# Patient Record
Sex: Male | Born: 2000 | Race: Black or African American | Hispanic: No | Marital: Single | Smoking: Current every day smoker
Health system: Southern US, Community
[De-identification: ages and names within clinical notes are randomized; demographics above are authoritative.]

## PROBLEM LIST (undated history)

## (undated) DIAGNOSIS — M1990 Unspecified osteoarthritis, unspecified site: Secondary | ICD-10-CM

## (undated) DIAGNOSIS — Z9109 Other allergy status, other than to drugs and biological substances: Secondary | ICD-10-CM

---
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
## 2020-09-16 ENCOUNTER — Other Ambulatory Visit: Payer: Self-pay

## 2020-09-16 ENCOUNTER — Encounter (HOSPITAL_COMMUNITY): Payer: Self-pay | Admitting: Emergency Medicine

## 2020-09-16 ENCOUNTER — Emergency Department (HOSPITAL_COMMUNITY)
Admission: EM | Admit: 2020-09-16 | Discharge: 2020-09-17 | Disposition: A | Discharge status: Home / Self Care | Payer: Medicaid Other | Attending: Emergency Medicine | Admitting: Emergency Medicine

## 2020-09-16 DIAGNOSIS — Z23 Encounter for immunization: Secondary | ICD-10-CM | POA: Insufficient documentation

## 2020-09-16 DIAGNOSIS — W268XXA Contact with other sharp object(s), not elsewhere classified, initial encounter: Secondary | ICD-10-CM | POA: Insufficient documentation

## 2020-09-16 DIAGNOSIS — S6991XA Unspecified injury of right wrist, hand and finger(s), initial encounter: Secondary | ICD-10-CM | POA: Diagnosis present

## 2020-09-16 DIAGNOSIS — S61411A Laceration without foreign body of right hand, initial encounter: Secondary | ICD-10-CM | POA: Diagnosis not present

## 2020-09-16 NOTE — ED Triage Notes (Signed)
Per EMS, pt was opening a can of greenbeans and cut his right palm.  Bleeding controlled, full movement of hand.  "Its been a minute" since last tetanus.

## 2020-09-17 MED ORDER — TETANUS-DIPHTH-ACELL PERTUSSIS 5-2.5-18.5 LF-MCG/0.5 IM SUSY
0.5000 mL | PREFILLED_SYRINGE | Freq: Once | INTRAMUSCULAR | Status: AC
  Administered 2020-09-17: 0.5 mL via INTRAMUSCULAR
  Filled 2020-09-17: qty 0.5

## 2020-09-17 NOTE — ED Provider Notes (Signed)
[facility] EMERGENCY DEPARTMENT Provider Note   CSN: [id number] Arrival date & time: 09/16/20  2339     History Chief Complaint  Patient presents with   Laceration    Justin Anderson is a 20 y.o. male.  The history is provided by the patient. No language interpreter was used.  Laceration Location:  Hand Hand laceration location:  R hand Depth:  Cutaneous Bleeding: uncontrolled   Pain details:    Quality:  Aching   Severity:  Moderate   Timing:  Constant   Progression:  Worsening Relieved by:  Nothing     Past Medical History:  Diagnosis Date   Arthritis     There are no problems to display for this patient.   History reviewed. No pertinent surgical history.     No family history on file.  Social History   Tobacco Use   Smoking status: Never   Smokeless tobacco: Never  Vaping Use   Vaping Use: Never used  Substance Use Topics   Alcohol use: No   Drug use: No    Home Medications Prior to Admission medications   Medication Sig Start Date End Date Taking? Authorizing Provider  amoxicillin (AMOXIL) 250 MG/5ML suspension Take 20 mLs (1,000 mg total) by mouth 2 (two) times daily. 04/09/12   Hess, Nada Boozer, PA-C    Allergies    Patient has no known allergies.  Review of Systems   Review of Systems  Skin:  Positive for wound.  All other systems reviewed and are negative.  Physical Exam Updated Vital Signs BP (!) 159/66 (BP Location: Right Arm)   Pulse 70   Temp 98.3 F (36.8 C) (Oral)   Resp 20   Ht 5\' 4"  (1.626 m)   Wt (!) 144.2 kg   SpO2 98%   BMI 54.58 kg/m   Physical Exam Cardiovascular:     Rate and Rhythm: Normal rate.  Pulmonary:     Effort: Pulmonary effort is normal.  Skin:    Comments: 2cm superficial laceration,  no gapping from  nv and ns intact  Neurological:     General: No focal deficit present.     Mental Status: He is alert.  Psychiatric:        Mood and Affect: Mood normal.    ED Results /  Procedures / Treatments   Labs (all labs ordered are listed, but only abnormal results are displayed) Labs Reviewed - No data to display  EKG None  Radiology No results found.  Procedures . Laceration Repair  Date/Time: 09/19/2020 1:32 PM Performed by: 09/21/2020, PA-C Authorized by: Elson Areas, PA-C   Consent:    Consent obtained:  Verbal   Consent given by:  Patient   Risks, benefits, and alternatives were discussed: yes     Alternatives discussed:  No treatment Universal protocol:    Procedure explained and questions answered to patient or proxy's satisfaction: yes     Patient identity confirmed:  Verbally with patient Anesthesia:    Anesthesia method:  None Laceration details:    Location: hand.   Length (cm):  2 Pre-procedure details:    Preparation:  Patient was prepped and draped in usual sterile fashion Exploration:    Wound exploration: wound explored through full range of motion     Contaminated: no   Treatment:    Area cleansed with:  Shur-Clens   Irrigation solution:  Sterile saline   Debridement:  None   Undermining:  None  Scar revision: no   Skin repair:    Repair method:  Tissue adhesive Approximation:    Approximation:  Loose Post-procedure details:    Procedure completion:  Tolerated   Medications Ordered in ED Medications  Tdap (BOOSTRIX) injection 0.5 mL (0.5 mLs Intramuscular Given 09/17/20 0706)    ED Course  I have reviewed the triage vital signs and the nursing notes.  Pertinent labs & imaging results that were available during my care of the patient were reviewed by me and considered in my medical decision making (see chart for details).    MDM Rules/Calculators/A&P                           MDM:  Pt counseled on dermabond Final Clinical Impression(s) / ED Diagnoses Final diagnoses:  Laceration of right hand, foreign body presence unspecified, initial encounter    Rx / DC Orders ED Discharge Orders     None      An After Visit Summary was printed and given to the patient.    Elson Areas, PA-C 09/19/20 1334    Franne Forts, DO 09/21/20 1911

## 2020-12-18 ENCOUNTER — Other Ambulatory Visit: Payer: Self-pay

## 2020-12-18 ENCOUNTER — Emergency Department (HOSPITAL_COMMUNITY)
Admission: EM | Admit: 2020-12-18 | Discharge: 2020-12-19 | Disposition: A | Discharge status: Home / Self Care | Payer: Medicaid Other | Attending: Emergency Medicine | Admitting: Emergency Medicine

## 2020-12-18 ENCOUNTER — Encounter (HOSPITAL_COMMUNITY): Payer: Self-pay

## 2020-12-18 DIAGNOSIS — Z20822 Contact with and (suspected) exposure to covid-19: Secondary | ICD-10-CM | POA: Diagnosis not present

## 2020-12-18 DIAGNOSIS — J111 Influenza due to unidentified influenza virus with other respiratory manifestations: Secondary | ICD-10-CM

## 2020-12-18 DIAGNOSIS — R799 Abnormal finding of blood chemistry, unspecified: Secondary | ICD-10-CM | POA: Insufficient documentation

## 2020-12-18 DIAGNOSIS — R509 Fever, unspecified: Secondary | ICD-10-CM | POA: Diagnosis present

## 2020-12-18 DIAGNOSIS — J101 Influenza due to other identified influenza virus with other respiratory manifestations: Secondary | ICD-10-CM | POA: Insufficient documentation

## 2020-12-18 LAB — CBG MONITORING, ED: Glucose-Capillary: 97 mg/dL (ref 70–99)

## 2020-12-18 MED ORDER — IBUPROFEN 200 MG PO TABS
600.0000 mg | ORAL_TABLET | Freq: Once | ORAL | Status: AC
  Administered 2020-12-18: 22:00:00 600 mg via ORAL
  Filled 2020-12-18: qty 3

## 2020-12-18 MED ORDER — ACETAMINOPHEN 325 MG PO TABS
650.0000 mg | ORAL_TABLET | Freq: Once | ORAL | Status: AC
  Administered 2020-12-18: 21:00:00 650 mg via ORAL
  Filled 2020-12-18: qty 2

## 2020-12-18 NOTE — ED Notes (Signed)
CBG of 29 inaccurate. Rechecked with a different machine.

## 2020-12-18 NOTE — ED Triage Notes (Signed)
Pt complains of fever and congestion x 1 day.

## 2020-12-18 NOTE — ED Provider Notes (Signed)
[facility]-EMERGENCY DEPT Provider Note   CSN: [id number] Arrival date & time: 12/18/20  2030     History Chief Complaint  Patient presents with   Fever   Nasal Congestion    Justin Anderson is a 20 y.o. male.  HPI Patient is a 20 year old male with past medical history significant for arthritis  Patient presented to the ER today with complaints of fever congestion sore throat fatigue ongoing for approximately 48 hours.  Denies any chest pain shortness breath lightheadedness or dizziness.  No nausea vomiting or diarrhea  No abdominal pain states he does have a mild cough however.  No other associate symptoms.  No aggravating mitigating factors has taken no medications prior to arrival in ER      Past Medical History:  Diagnosis Date   Arthritis     There are no problems to display for this patient.   History reviewed. No pertinent surgical history.     History reviewed. No pertinent family history.  Social History   Tobacco Use   Smoking status: Never   Smokeless tobacco: Never  Vaping Use   Vaping Use: Never used  Substance Use Topics   Alcohol use: No   Drug use: No    Home Medications Prior to Admission medications   Medication Sig Start Date End Date Taking? Authorizing Provider  ibuprofen (ADVIL) 600 MG tablet Take 1 tablet (600 mg total) by mouth every 6 (six) hours as needed for moderate pain, mild pain or fever. 12/19/20  Yes Derak Schurman S, PA  ondansetron (ZOFRAN-ODT) 4 MG disintegrating tablet Take 1 tablet (4 mg total) by mouth every 8 (eight) hours as needed for nausea or vomiting. 12/19/20  Yes Carlyle Achenbach S, PA  amoxicillin (AMOXIL) 250 MG/5ML suspension Take 20 mLs (1,000 mg total) by mouth 2 (two) times daily. 04/09/12   Hess, Nada Boozer, PA-C    Allergies    Patient has no known allergies.  Review of Systems   Review of Systems  Constitutional:  Positive for chills, fatigue and fever.  HENT:  Positive for  postnasal drip, rhinorrhea and sore throat. Negative for congestion.   Eyes:  Negative for redness.  Respiratory:  Positive for cough. Negative for shortness of breath.   Cardiovascular:  Negative for chest pain and leg swelling.  Gastrointestinal:  Negative for abdominal distention, abdominal pain, diarrhea, nausea and vomiting.  Endocrine: Negative for polyphagia.  Genitourinary:  Negative for dysuria.  Musculoskeletal:  Negative for myalgias.  Skin:  Negative for rash.  Neurological:  Negative for dizziness, syncope and headaches.  Psychiatric/Behavioral:  Negative for confusion.    Physical Exam Updated Vital Signs BP (!) 143/73 (BP Location: Left Arm)   Pulse 90   Temp 98.6 F (37 C) (Oral)   Resp 16   Ht 5\' 4"  (1.626 m)   Wt (!) 145.2 kg   SpO2 98%   BMI 54.93 kg/m   Physical Exam Vitals and nursing note reviewed.  Constitutional:      General: He is not in acute distress.    Comments: 20 year old male morbidly obese.  Warm to touch appears uncomfortable but in no acute distress does not appear toxic.  HENT:     Head: Normocephalic and atraumatic.     Nose: Nose normal.     Mouth/Throat:     Comments: Symmetric 2+ tonsillar hypertrophy some posterior pharyngeal erythema.  Uvula midline.  Normal phonation. Eyes:     General: No scleral icterus. Cardiovascular:  Rate and Rhythm: Regular rhythm. Tachycardia present.     Pulses: Normal pulses.     Heart sounds: Normal heart sounds.  Pulmonary:     Effort: Pulmonary effort is normal. No respiratory distress.     Breath sounds: Normal breath sounds. No wheezing.  Abdominal:     Palpations: Abdomen is soft.     Tenderness: There is no abdominal tenderness. There is no guarding or rebound.     Comments: Abdomen soft nontender  Musculoskeletal:     Cervical back: Normal range of motion.     Right lower leg: No edema.     Left lower leg: No edema.  Skin:    General: Skin is warm and dry.     Capillary Refill:  Capillary refill takes less than 2 seconds.  Neurological:     Mental Status: He is alert. Mental status is at baseline.  Psychiatric:        Mood and Affect: Mood normal.        Behavior: Behavior normal.    ED Results / Procedures / Treatments   Labs (all labs ordered are listed, but only abnormal results are displayed) Labs Reviewed  RESP PANEL BY RT-PCR (FLU A&B, COVID) ARPGX2 - Abnormal; Notable for the following components:      Result Value   Influenza A by PCR POSITIVE (*)    All other components within normal limits  CBG MONITORING, ED - Abnormal; Notable for the following components:   Glucose-Capillary 29 (*)    All other components within normal limits  GROUP A STREP BY PCR  CBG MONITORING, ED    EKG None  Radiology No results found.  Procedures Procedures   Medications Ordered in ED Medications  acetaminophen (TYLENOL) tablet 650 mg (650 mg Oral Given 12/18/20 2127)  ibuprofen (ADVIL) tablet 600 mg (600 mg Oral Given 12/18/20 2212)    ED Course  I have reviewed the triage vital signs and the nursing notes.  Pertinent labs & imaging results that were available during my care of the patient were reviewed by me and considered in my medical decision making (see chart for details).  Clinical Course as of 12/19/20 0048  Mon Dec 19, 2020  0027 Influenza A By PCR(!): POSITIVE [WF]    Clinical Course User Index [WF] Gailen Shelter, [place]   MDM Rules/Calculators/A&P                          Patient with influenza A.  Symptoms consistent with this.  Fever improved with Tylenol and ibuprofen given here in the ER.  Initial temperature 1-1.8 improved to 98.6.  Tachycardic initially but this improved with fever control.  Tolerating p.o. given some fluids in the ER will discharge home with conservative therapy Tylenol ibuprofen and Zofran for any nausea should he experience this recommend decongestants and Zyrtec.  Also recommend Flonase. Strep test  negative.  Return precautions given.  Patient agreeable to plan.  All questions answered the best my ability.  Final Clinical Impression(s) / ED Diagnoses Final diagnoses:  Influenza    Rx / DC Orders ED Discharge Orders          Ordered    ondansetron (ZOFRAN-ODT) 4 MG disintegrating tablet  Every 8 hours PRN        12/19/20 0029    ibuprofen (ADVIL) 600 MG tablet  Every 6 hours PRN        12/19/20 0029  Solon Augusta [place], [place] 12/19/20 0105    Rolan Bucco, MD 12/26/20 1454

## 2020-12-19 LAB — RESP PANEL BY RT-PCR (FLU A&B, COVID) ARPGX2
Influenza A by PCR: POSITIVE — AB
Influenza B by PCR: NEGATIVE
SARS Coronavirus 2 by RT PCR: NEGATIVE

## 2020-12-19 LAB — GROUP A STREP BY PCR: Group A Strep by PCR: NOT DETECTED

## 2020-12-19 LAB — CBG MONITORING, ED: Glucose-Capillary: 29 mg/dL — CL (ref 70–99)

## 2020-12-19 MED ORDER — ONDANSETRON 4 MG PO TBDP: 4.0000 mg | ORAL_TABLET | Freq: Three times a day (TID) | ORAL | 0 refills | Status: DC | PRN

## 2020-12-19 MED ORDER — IBUPROFEN 600 MG PO TABS: 600.0000 mg | ORAL_TABLET | Freq: Four times a day (QID) | ORAL | 0 refills | Status: DC | PRN

## 2020-12-19 NOTE — Discharge Instructions (Addendum)
You have influenza.  Drink plenty of water and read the information about treatment of viral illnesses below   Please use Tylenol or ibuprofen for pain.  You may use 600 mg ibuprofen every 6 hours or 1000 mg of Tylenol every 6 hours.  You may choose to alternate between the 2.  This would be most effective.  Not to exceed 4 g of Tylenol within 24 hours.  Not to exceed 3200 mg ibuprofen 24 hours.      Please use Tylenol or ibuprofen for pain.  You may use 600 mg ibuprofen every 6 hours or 1000 mg of Tylenol every 6 hours.  You may choose to alternate between the 2.  This would be most effective.  Not to exceed 4 g of Tylenol within 24 hours.  Not to exceed 3200 mg ibuprofen 24 hours.

## 2022-02-16 NOTE — Progress Notes (Unsigned)
  Subjective:    Justin Anderson - 22 y.o. male MRN [id number]  Date of birth: October 14, 2000  HPI  Justin Anderson is to establish care.   Current issues and/or concerns:  ROS per HPI     Health Maintenance:  Health Maintenance Due  Topic Date Due   COVID-19 Vaccine (1) Never done   HPV VACCINES (1 - Male 2-dose series) Never done   HIV Screening  Never done   Hepatitis C Screening  Never done   INFLUENZA VACCINE  Never done     Past Medical History: There are no problems to display for this patient.     Social History   reports that he has never smoked. He has never used smokeless tobacco. He reports that he does not drink alcohol and does not use drugs.   Family History  family history is not on file.   Medications: reviewed and updated   Objective:   Physical Exam There were no vitals taken for this visit. Physical Exam      Assessment & Plan:         Patient was given clear instructions to go to Emergency Department or return to medical center if symptoms don't improve, worsen, or new problems develop.The patient verbalized understanding.  I discussed the assessment and treatment plan with the patient. The patient was provided an opportunity to ask questions and all were answered. The patient agreed with the plan and demonstrated an understanding of the instructions.   The patient was advised to call back or seek an in-person evaluation if the symptoms worsen or if the condition fails to improve as anticipated.    Durene Fruits, NP 02/16/2022, 7:57 AM Primary Care at [facility]

## 2022-02-19 ENCOUNTER — Encounter: Payer: Medicaid Other | Admitting: Family

## 2022-02-19 DIAGNOSIS — Z7689 Persons encountering health services in other specified circumstances: Secondary | ICD-10-CM

## 2022-07-09 ENCOUNTER — Ambulatory Visit
Admission: EM | Admit: 2022-07-09 | Discharge: 2022-07-09 | Disposition: A | Discharge status: Home / Self Care | Payer: Medicaid Other

## 2022-07-09 ENCOUNTER — Encounter: Payer: Self-pay | Admitting: *Deleted

## 2022-07-09 DIAGNOSIS — B354 Tinea corporis: Secondary | ICD-10-CM

## 2022-07-09 DIAGNOSIS — L219 Seborrheic dermatitis, unspecified: Secondary | ICD-10-CM

## 2022-07-09 HISTORY — DX: Other allergy status, other than to drugs and biological substances: Z91.09

## 2022-07-09 MED ORDER — KETOCONAZOLE 2 % EX SHAM: 1.0000 | MEDICATED_SHAMPOO | CUTANEOUS | 0 refills | Status: DC

## 2022-07-09 MED ORDER — CLOTRIMAZOLE 1 % EX CREA: TOPICAL_CREAM | CUTANEOUS | 0 refills | Status: DC

## 2022-07-09 NOTE — ED Triage Notes (Signed)
C/O starting with pruritus to base of scalp approx 2-3 yrs ago with occasional drainage. Started with large irregular areas of hyperpigmentation to left upper arm and torso approx 2 wks ago. States was initially pruritic, but is no longer. Has not been applying any topical creams.

## 2022-07-09 NOTE — ED Provider Notes (Signed)
[facility]    CSN: [id number] Arrival date & time: 07/09/22  1103      History   Chief Complaint Chief Complaint  Patient presents with   Rash    HPI Justin Anderson is a 22 y.o. male.   Patient presents to urgent care for evaluation of rash to the scalp that has been present for the last 2 to 3 years and rash to the bilateral arms and chest that has been present for the last 2 weeks.  States rash to the scalp is very itchy and he has been treated for a fungal infection to the scalp in the past but cannot remember when he was treated.  Rash to the arms is also very itchy and he has been trying very hard not to scratch.  Denies recent known sick contacts with similar rash/symptoms.  No recent changes in body wash, shampoos, exposure to known allergens, exposure to new foods/plants/animals, or recent new laundry detergents.  No recent fevers or chills.  No body aches, nausea, vomiting, sore throat, or dizziness.  No recent travel.  No recent swimming in lakes/pools.  No history of diabetes or immunosuppression.     Past Medical History:  Diagnosis Date   Environmental allergies     There are no problems to display for this patient.   History reviewed. No pertinent surgical history.     Home Medications    Prior to Admission medications   Medication Sig Start Date End Date Taking? Authorizing Provider  Cetirizine HCl (ZYRTEC PO) Take by mouth.   Yes [provider]  clotrimazole (LOTRIMIN) 1 % cream Apply to affected area 2 times daily 07/09/22  Yes Akiem Urieta, Donavan Burnet, FNP  ketoconazole (NIZORAL) 2 % shampoo Apply 1 Application topically 2 (two) times a week. Lather onto scalp and chest rash. Allow to sit on the skin for 5-10 minutes, then rinse off with water. Do this twice a week on Mondays and Thursdays for 4 weeks. 07/09/22  Yes StanhopeDonavan Burnet, FNP    Family History Family History  Problem Relation Age of Onset   Hypertension Mother     Diabetes type II Father     Social History Social History   Tobacco Use   Smoking status: Every Day    Types: Cigars   Smokeless tobacco: Never  Vaping Use   Vaping Use: Never used  Substance Use Topics   Alcohol use: Not Currently    Alcohol/week: 4.0 standard drinks of alcohol    Types: 4 Shots of liquor per week   Drug use: Yes    Types: Marijuana     Allergies   Patient has no known allergies.   Review of Systems Review of Systems Per HPI  Physical Exam Triage Vital Signs ED Triage Vitals  Enc Vitals Group     BP 07/09/22 1211 (!) 154/91     Pulse Rate 07/09/22 1211 87     Resp 07/09/22 1211 16     Temp 07/09/22 1211 98.1 F (36.7 C)     Temp Source 07/09/22 1211 Oral     SpO2 07/09/22 1211 98 %     Weight --      Height --      Head Circumference --      Peak Flow --      Pain Score 07/09/22 1213 0     Pain Loc --      Pain Edu? --      Excl.  in GC? --    No data found.  Updated Vital Signs BP (!) 154/91   Pulse 87   Temp 98.1 F (36.7 C) (Oral)   Resp 16   SpO2 98%   Visual Acuity Right Eye Distance:   Left Eye Distance:   Bilateral Distance:    Right Eye Near:   Left Eye Near:    Bilateral Near:     Physical Exam Vitals and nursing note reviewed.  Constitutional:      Appearance: He is not ill-appearing or toxic-appearing.  HENT:     Head: Normocephalic and atraumatic.     Right Ear: Hearing and external ear normal.     Left Ear: Hearing and external ear normal.     Nose: Nose normal.     Mouth/Throat:     Lips: Pink.  Eyes:     General: Lids are normal. Vision grossly intact. Gaze aligned appropriately.     Extraocular Movements: Extraocular movements intact.     Conjunctiva/sclera: Conjunctivae normal.  Pulmonary:     Effort: Pulmonary effort is normal.  Musculoskeletal:     Cervical back: Neck supple.  Skin:    General: Skin is warm and dry.     Capillary Refill: Capillary refill takes less than 2 seconds.      Findings: Rash (Hyperpigmented splotchy rash to the chest and bilateral upper extremities as seen in image below.  There are some white annular scaly lesions dispersed throughout rash.) present.     Comments: No signs of secondary bacterial infection such as redness, warmth, swelling, or drainage.  Neurological:     General: No focal deficit present.     Mental Status: He is alert and oriented to person, place, and time. Mental status is at baseline.     Cranial Nerves: No dysarthria or facial asymmetry.  Psychiatric:        Mood and Affect: Mood normal.        Speech: Speech normal.        Behavior: Behavior normal.        Thought Content: Thought content normal.        Judgment: Judgment normal.    Chest   Posterior scalp/neck    UC Treatments / Results  Labs (all labs ordered are listed, but only abnormal results are displayed) Labs Reviewed - No data to display  EKG   Radiology No results found.  Procedures Procedures (including critical care time)  Medications Ordered in UC Medications - No data to display  Initial Impression / Assessment and Plan / UC Course  I have reviewed the triage vital signs and the nursing notes.  Pertinent labs & imaging results that were available during my care of the patient were reviewed by me and considered in my medical decision making (see chart for details).   1. Chronic seborrheic dermatitis, tinea corporis Presentation is consistent with seborrheic dermatitis to the scalp and tinea corporis to the chest/arms.  No signs of secondary bacterial infection.  Will treat seborrheic dermatitis with ketoconazole shampoo as prescribed twice weekly.  Advised to lather shampoo and apply to the scalp/chest wall twice weekly in the shower.  Advised to keep the shampoo on his body for 5 to 10 minutes prior to washing off with water.  Clotrimazole cream twice daily for the next 10 days to the chest/arms.  Advised to avoid excessive moisture to the  rash.  Bacterial infection return precautions discussed.  He is agreeable with plan.  He does  not have insurance, however he has been given information for [facility] as well as [facility] to establish care for PCP for follow-up.  Discussed physical exam and available lab work findings in clinic with patient.  Counseled patient regarding appropriate use of medications and potential side effects for all medications recommended or prescribed today. Discussed red flag signs and symptoms of worsening condition,when to call the PCP office, return to urgent care, and when to seek higher level of care in the emergency department. Patient verbalizes understanding and agreement with plan. All questions answered. Patient discharged in stable condition.    Final Clinical Impressions(s) / UC Diagnoses   Final diagnoses:  Chronic seborrheic dermatitis  Tinea corporis     Discharge Instructions      Your rash is due to a fungal infection.  Use ketoconazole as directed twice a week.  Use clotrimazole cream to the rash on your chest/arms twice a day for 10 days. Avoid itching skin.  If you notice any pus lesions, warmth, redness, swelling, or worsening irritation, please return to urgent care for reevaluation.   Follow-up with one of the PCP offices listed on your paperwork for follow-up evaluation.  I hope you feel better!      ED Prescriptions     Medication Sig Dispense Auth. Provider   ketoconazole (NIZORAL) 2 % shampoo Apply 1 Application topically 2 (two) times a week. Lather onto scalp and chest rash. Allow to sit on the skin for 5-10 minutes, then rinse off with water. Do this twice a week on Mondays and Thursdays for 4 weeks. 120 mL Reita May M, FNP   clotrimazole (LOTRIMIN) 1 % cream Apply to affected area 2 times daily 15 g Carlisle Beers, FNP      PDMP not reviewed this encounter.   Carlisle Beers,  [place] 07/09/22 1251

## 2022-07-09 NOTE — Discharge Instructions (Signed)
Your rash is due to a fungal infection.  Use ketoconazole as directed twice a week.  Use clotrimazole cream to the rash on your chest/arms twice a day for 10 days. Avoid itching skin.  If you notice any pus lesions, warmth, redness, swelling, or worsening irritation, please return to urgent care for reevaluation.   Follow-up with one of the PCP offices listed on your paperwork for follow-up evaluation.  I hope you feel better!

## 2022-07-21 ENCOUNTER — Ambulatory Visit (HOSPITAL_COMMUNITY)
Admission: EM | Admit: 2022-07-21 | Discharge: 2022-07-21 | Disposition: A | Discharge status: Home / Self Care | Payer: Medicaid Other | Attending: Nurse Practitioner | Admitting: Nurse Practitioner

## 2022-07-21 DIAGNOSIS — F4320 Adjustment disorder, unspecified: Secondary | ICD-10-CM | POA: Insufficient documentation

## 2022-07-21 NOTE — ED Notes (Signed)
Dispatch called on phone to be notified of need for transport of patient to return to residence.

## 2022-07-21 NOTE — Discharge Instructions (Signed)
  Discharge recommendations:  Patient is to take medications as prescribed. Please see information for follow-up appointment with psychiatry and therapy. Please follow up with your primary care provider for all medical related needs.   Therapy: We recommend that patient participate in individual therapy to address mental health concerns. Patient can come to [facility] outpatient walk in services for individual outpatient services.   Medications: The patient or guardian is to contact a medical professional and/or outpatient provider to address any new side effects that develop. The patient or guardian should update outpatient providers of any new medications and/or medication changes.    Safety:  The patient should abstain from use of illicit substances/drugs and abuse of any medications. If symptoms worsen or do not continue to improve or if the patient becomes actively suicidal or homicidal then it is recommended that the patient return to the closest hospital emergency department, the [facility], or call 911 for further evaluation and treatment. National Suicide Prevention Lifeline 1-800-SUICIDE or (838)303-1223.  About 988 988 offers 24/7 access to trained crisis counselors who can help people experiencing mental health-related distress. People can call or text 988 or chat 988lifeline.org for themselves or if they are worried about a loved one who may need crisis support.  Crisis Mobile: [facility]:                     669-253-2835 (for crisis response 24 hours a day) [facility] Hotline:                                            715 770 2729

## 2022-07-21 NOTE — Progress Notes (Signed)
   07/21/22 0408  [facility] Triage Screening (Walk-ins at [facility] only)  How Did You Hear About [place]? Legal System  What Is the Reason for Your Visit/Call Today? Justin Anderson is a 22 year old single male who presents voluntarily via law enforcement with complaints of anger. Patient reports he was at his grandmother's home watching television tonight, when he became angry while thinking about an ex-girlfriend, he broke up with a long time ago. Patient says the anger was building up and began to pace. Patient states his grandmother saw him breathing heavy and angry, which made her call the police. Patient says this also happened about three weeks ago. Patient denies a history of mental health diagnosis. Patient is not engaged in mental health services. Patient denies SI, HI, auditory or visual hallucinations. Patient denies substance use. Patient denies access to firearms. Patient reports he could benefit from counseling.  How Long Has This Been Causing You Problems? <Week  Have You Recently Had Any Thoughts About Hurting Yourself? No  Are You Planning to Commit Suicide/Harm Yourself At This time? No  Have you Recently Had Thoughts About Hurting Someone [name]? No  Are You Planning To Harm Someone At This Time? No  Are you currently experiencing any auditory, visual or other hallucinations? No  Have You Used Any Alcohol or Drugs in the Past 24 Hours? No  Do you have any current medical co-morbidities that require immediate attention? No  Clinician description of patient physical appearance/behavior: Patient is dressed casually, alert and oriented with normal speech. Patient makes good eye contact and is speech is coherent. There is no indication he is responding to internal stimuli. Patient's mood is euthymic. Patient is calm and cooperative.  What Do You Feel Would Help You the Most Today? Treatment for Depression or other mood problem  If access to [facility] was not available, would you have sought care  in the Emergency Department? No  Determination of Need Routine (7 days)  Options For Referral Outpatient Therapy

## 2022-07-21 NOTE — ED Notes (Addendum)
AVS given to patient and discharge instructions explained. Guest verbalized understanding and denied questions. Patient awaiting transport from [place] [employer]. Dispatch has been notified and is waiting in lobby (Per ok to wait in lobby Roselyn Bering NP)

## 2022-07-21 NOTE — ED Provider Notes (Signed)
Behavioral Health Urgent Care Medical Screening Exam  Patient Name: Justin Anderson MRN: [id number] Date of Evaluation: 07/21/22 Chief Complaint:  I got angry tonight Diagnosis: Adjustment disorder unspecified type Final diagnoses:  Adjustment disorder, unspecified type    History of Present illness: Justin Anderson is a 22 y.o.  single male presenting to [facility] via [employer] for having angry outbursts at home.  Patient reports that he lives at home with his grandparents and some other relatives.  Patient reports that he is employed at [employer].  Patient states that he was home watching TV and he started overthinking about someone that he missed that he really cares about.  Patient reports that he was is overthinking the situation that he became aggravated, pacing and started breathing hard and he hit the door to his room.  Patient states that his grandmother called the police.    Nurse practitioner assessed patient face-to-face and chart was reviewed.  Patient is alert oriented x 4, calm and cooperative, speech is clear and coherent and mood is euthymic with congruent affect.  Patient denies any previous inpatient mental health treatment. Patient denies having access to any weapons.  Patient denies using any illicit substance or alcohol use.    Patient states that he is not on any medication nor does he see a psychiatrist or therapist.  Patient reports that he saw a therapist as a child after his mother passed away and he found it helpful and he would like to start seeing a therapist again.  Patient denies any suicidal ideations or auditory or visual hallucinations or self injures behaviors.  Patient does not appear to responding to any internal, external stimuli, or paranoia, delusions, or mania.   Patient does not appear to be a danger to himself or others and does not meet inpatient criteria and can be discharged with outpatient community resources for ongoing mental health treatment.  Patient is in agreement with the plan.    Flowsheet Row ED from 07/21/2022 in [facility] ED from 12/18/2020 in [facility] Emergency Department at [facility] ED from 09/16/2020 in [facility] Emergency Department at [facility]  C-SSRS RISK CATEGORY No Risk No Risk No Risk       Psychiatric Specialty Exam  Presentation  General Appearance:Casual  Eye Contact:Good  Speech:Clear and Coherent  Speech Volume:Normal  Handedness:No data recorded  Mood and Affect  Mood: Euthymic  Affect: Appropriate   Thought Process  Thought Processes: Coherent  Descriptions of Associations:Intact  Orientation:Full (Time, Place and Person)  Thought Content:WDL    Hallucinations:None  Ideas of Reference:None  Suicidal Thoughts:No  Homicidal Thoughts:No   Sensorium  Memory: Immediate Fair; Recent Fair; Remote Fair  Judgment: Fair  Insight: Fair   Executive Functions  Concentration: Good  Attention Span: Good  Recall: Fair  Fund of Knowledge: Fair  Language: Good   Psychomotor Activity  Psychomotor Activity: Normal   Assets  Assets: Communication Skills; Desire for Improvement; Housing; Physical Health; Resilience   Sleep  Sleep: Fair  Number of hours:  -1   Physical Exam: Physical Exam HENT:     Head: Normocephalic and atraumatic.     Nose: Nose normal.  Eyes:     Pupils: Pupils are equal, round, and reactive to light.  Cardiovascular:     Rate and Rhythm: Normal rate.  Pulmonary:     Effort: Pulmonary effort is normal.  Abdominal:     General: Abdomen is flat.  Musculoskeletal:        General: Normal range of motion.     Cervical back: Normal range of motion.  Skin:    General: Skin is warm.  Neurological:     Mental Status: He is alert and oriented to person, place, and time.  Psychiatric:        Attention and Perception: Attention normal.        Mood and Affect: Mood normal.         Speech: Speech normal.        Behavior: Behavior is cooperative.        Thought Content: Thought content is not paranoid or delusional. Thought content does not include homicidal or suicidal ideation. Thought content does not include homicidal or suicidal plan.        Cognition and Memory: Cognition normal.        Judgment: Judgment is impulsive.    Review of Systems  Constitutional: Negative.   HENT: Negative.    Eyes: Negative.   Respiratory: Negative.    Cardiovascular: Negative.   Gastrointestinal: Negative.   Genitourinary: Negative.   Musculoskeletal: Negative.   Skin: Negative.   Neurological: Negative.   Endo/Heme/Allergies: Negative.   Psychiatric/Behavioral:  The patient is nervous/anxious.    Blood pressure (!) 146/94, pulse 81, temperature 98.3 F (36.8 C), temperature source Oral, resp. rate 18, SpO2 99 %. There is no height or weight on file to calculate BMI.  Musculoskeletal: Strength & Muscle Tone: within normal limits Gait & Station: normal Patient leans: N/A   [facility] MSE Discharge Disposition for Follow up and Recommendations: Based on my evaluation the patient does not appear to have an emergency medical condition and can be discharged with resources and follow up care in outpatient services for Medication Management and Individual Therapy   Jasper Riling, NP 07/21/2022, 4:29 AM

## 2022-07-22 ENCOUNTER — Other Ambulatory Visit: Payer: Self-pay

## 2022-07-22 ENCOUNTER — Encounter (HOSPITAL_COMMUNITY): Payer: Self-pay

## 2022-07-22 ENCOUNTER — Emergency Department (EMERGENCY_DEPARTMENT_HOSPITAL)
Admission: EM | Admit: 2022-07-22 | Discharge: 2022-07-23 | Disposition: A | Discharge status: Home / Self Care | Payer: Medicaid Other | Source: Home / Self Care | Attending: Emergency Medicine | Admitting: Emergency Medicine

## 2022-07-22 DIAGNOSIS — F332 Major depressive disorder, recurrent severe without psychotic features: Secondary | ICD-10-CM

## 2022-07-22 DIAGNOSIS — R45851 Suicidal ideations: Secondary | ICD-10-CM | POA: Insufficient documentation

## 2022-07-22 DIAGNOSIS — R45850 Homicidal ideations: Secondary | ICD-10-CM | POA: Insufficient documentation

## 2022-07-22 DIAGNOSIS — F322 Major depressive disorder, single episode, severe without psychotic features: Secondary | ICD-10-CM

## 2022-07-22 LAB — RAPID URINE DRUG SCREEN, HOSP PERFORMED
Amphetamines: NOT DETECTED
Barbiturates: NOT DETECTED
Benzodiazepines: NOT DETECTED
Cocaine: NOT DETECTED
Opiates: NOT DETECTED
Tetrahydrocannabinol: POSITIVE — AB

## 2022-07-22 LAB — CBC
HCT: 45.3 % (ref 39.0–52.0)
Hemoglobin: 14.6 g/dL (ref 13.0–17.0)
MCH: 28.2 pg (ref 26.0–34.0)
MCHC: 32.2 g/dL (ref 30.0–36.0)
MCV: 87.5 fL (ref 80.0–100.0)
Platelets: 318 10*3/uL (ref 150–400)
RBC: 5.18 MIL/uL (ref 4.22–5.81)
RDW: 14.3 % (ref 11.5–15.5)
WBC: 7.4 10*3/uL (ref 4.0–10.5)
nRBC: 0 % (ref 0.0–0.2)

## 2022-07-22 LAB — COMPREHENSIVE METABOLIC PANEL
ALT: 23 U/L (ref 0–44)
AST: 22 U/L (ref 15–41)
Albumin: 3.8 g/dL (ref 3.5–5.0)
Alkaline Phosphatase: 74 U/L (ref 38–126)
Anion gap: 7 (ref 5–15)
BUN: 6 mg/dL (ref 6–20)
CO2: 27 mmol/L (ref 22–32)
Calcium: 9.1 mg/dL (ref 8.9–10.3)
Chloride: 103 mmol/L (ref 98–111)
Creatinine, Ser: 0.91 mg/dL (ref 0.61–1.24)
GFR, Estimated: >60 mL/min (ref >60)
Glucose, Bld: 85 mg/dL (ref 70–99)
Potassium: 3.6 mmol/L (ref 3.5–5.1)
Sodium: 137 mmol/L (ref 135–145)
Total Bilirubin: 0.7 mg/dL (ref 0.3–1.2)
Total Protein: 7.2 g/dL (ref 6.5–8.1)

## 2022-07-22 LAB — ACETAMINOPHEN LEVEL: Acetaminophen (Tylenol), Serum: <10 ug/mL — ABNORMAL LOW (ref 10–30)

## 2022-07-22 LAB — SALICYLATE LEVEL: Salicylate Lvl: <7 mg/dL — ABNORMAL LOW (ref 7.0–30.0)

## 2022-07-22 LAB — ETHANOL: Alcohol, Ethyl (B): <10 mg/dL (ref <10)

## 2022-07-22 MED ORDER — SERTRALINE HCL 50 MG PO TABS
50.0000 mg | ORAL_TABLET | Freq: Every day | ORAL | Status: DC
  Administered 2022-07-22 – 2022-07-23 (×2): 50 mg via ORAL
  Filled 2022-07-22 (×2): qty 1

## 2022-07-22 NOTE — ED Notes (Signed)
Pt's belongings placed in locker 6. Pt wanded by security

## 2022-07-22 NOTE — ED Triage Notes (Signed)
Pt has SI and his plan is to cut himself.

## 2022-07-22 NOTE — ED Notes (Signed)
Pt voluntary for now

## 2022-07-22 NOTE — Consult Note (Cosign Needed Addendum)
BH ED ASSESSMENT   Reason for Consult:  Psych Consult Referring Physician:   Jeanella Flattery   Patient Identification: Justin Anderson MRN:  [id number] ED Chief Complaint: MDD (major depressive disorder), recurrent severe, without psychosis (HCC)  Diagnosis:  Principal Problem:   MDD (major depressive disorder), recurrent severe, without psychosis (HCC)   ED Assessment Time Calculation: Start Time: 1750 Stop Time: 1815 Total Time in Minutes (Assessment Completion): 25   Subjective:    Justin Anderson is a 22 y.o. AA male with a past psychiatric history of depression, with pertinent medical comorbidities that include obesity, who presents this encounter by way of self voluntarily for worsening depressive mood and thoughts of suicide.   HPI:    Patient seen today for evaluation at [facility] emergency department face-to-face.  Upon evaluation, patient tells me that he has been struggling with depression since he was a child, but recently states as of last week things have just gotten "oddly" out of control for him, and that he has been having concerning suicidal thoughts and feels he needs help. Patient tells me that he does not have any specific psychosocial stressors that are bothering him, just that the combination of all the things that have been going wrong in his life, as well as hurtful things that have happened in his relationships over the years are really beginning to bother him. Patient endorses that he is sleeping poorly, finds himself very agitated often wanting to "just kill someone around me, literally just hurt everyone", experiencing significantly poor appetite, loss of interest or pleasure, experiencing worsening anxiety with panic attacks, and having suicidal thoughts that make the patient feel unsafe. Patient reports that starting at the beginning of this week he developed intermittent thoughts of wanting to die and "go home", but around Thursday he stopped playing  videogames which is a coping skill for him, proceeded to start drinking heavily, and then broke a bottle of alcohol and was seriously considering, "just cutting myself to death."  Patient endorses currently that he is not having suicidal ideations, but rates himself "8 out of 10" concern for his safety, stating that over this last week when he is having trouble sleeping and gets anxious, he starts getting really concerning suicidal thoughts and wanting to die.  Patient orientation is intact.  Patient denies auditory visual hallucinations, delusional themes, paranoid ideations, and objectively, does not present with psychotic features.  Patient endorses that he lives with his Justin Anderson, reports that she is very supportive of him, reports that he started living with her after his Justin Anderson died at 46.  Patient endorses no previous mental health history outside of depression, denies outpatient services ever utilized for medications or therapy, denies any mental health hospitalizations.  Patient endorses infrequent EtOH use, daily tobacco use, and daily cannabis use. Patient endorses no history of suicide attempts in recent years, but does endorse attempting to stab himself to death with a knife at the age of 75 due to stress.  Attempting to focus the patient on psychosocial stressors, patient endorses that his biggest stressors he is feeling are "traumatic" relationship break-ups he has had and the loss of his mother.  Patient reports outside of Justin Anderson, father and brother are also supportive, states they live nearby.  Collateral [employer], [place]) [phone] x2 no answer, left HIPAA compliant voicemail to call provider, then answered phone.  Per collateral from grandmother, whom the patient lives with, patient's grandmother tells me that the patient has been very concerning for  her, tells me that he has been sleeping very poorly, very depressed, very agitated lately, started utilizing profanity lately and  expressing homicidal and suicidal ideations, all of which she states is not normal for him. She reports that she is concerned for safety for him, and also for him potentially hurting her, states that she has had to take scissors away from him this last week and really get on him about not drinking and using substances i.e. marijuana/EOTH. She reports that he is potentially responding to internal stimuli, reports that she hears him talking to himself in his room, states that it sounds like he is being either really negative to himself because he is depressed or hearing voices, "or something."  She reports no previous suicide attempts in his past and/or psychiatric history in his past.  She reports like patient does, that things have gotten out of hand over the last week, but also that he has been progressively over the last several months worsening in his depression. She states he is welcome home once he receives treatment.   Past Psychiatric History: Depression  Risk to Self or Others: Is the patient at risk to self? Yes Has the patient been a risk to self in the past 6 months? No Has the patient been a risk to self within the distant past? Yes Is the patient a risk to others? Yes Has the patient been a risk to others in the past 6 months? No Has the patient been a risk to others within the distant past? No  [place] Scale:  Flowsheet Row ED from 07/22/2022 in [facility] Emergency Department at [facility] ED from 07/09/2022 in [facility] at [facility])  C-SSRS RISK CATEGORY High Risk No Risk      Substance Abuse:  THC daily, Tobacco daily, ETOH   Past Medical History:  Past Medical History:  Diagnosis Date   Environmental allergies    History reviewed. No pertinent surgical history. Family History:  Family History  Problem Relation Age of Onset   Hypertension Mother    Diabetes type II Father    Family Psychiatric  History: None endorsed Social  History:  Social History   Substance and Sexual Activity  Alcohol Use Not Currently   Alcohol/week: 4.0 standard drinks of alcohol   Types: 4 Shots of liquor per week     Social History   Substance and Sexual Activity  Drug Use Yes   Types: Marijuana    Social History   Socioeconomic History   Marital status: Single    Spouse name: Not on file   Number of children: Not on file   Years of education: Not on file   Highest education level: Not on file  Occupational History   Not on file  Tobacco Use   Smoking status: Every Day    Types: Cigars   Smokeless tobacco: Never  Vaping Use   Vaping Use: Never used  Substance and Sexual Activity   Alcohol use: Not Currently    Alcohol/week: 4.0 standard drinks of alcohol    Types: 4 Shots of liquor per week   Drug use: Yes    Types: Marijuana   Sexual activity: Not on file  Other Topics Concern   Not on file  Social History Narrative   Not on file   Social Determinants of Health   Financial Resource Strain: Not on file  Food Insecurity: Not on file  Transportation Needs: Not on file  Physical  Activity: Not on file  Stress: Not on file  Social Connections: Not on file   Additional Social History:    Allergies:  No Known Allergies  Labs:  Results for orders placed or performed during the hospital encounter of 07/22/22 (from the past 48 hour(s))  Rapid urine drug screen (hospital performed)     Status: Abnormal   Collection Time: 07/22/22  4:20 PM  Result Value Ref Range   Opiates NONE DETECTED NONE DETECTED   Cocaine NONE DETECTED NONE DETECTED   Benzodiazepines NONE DETECTED NONE DETECTED   Amphetamines NONE DETECTED NONE DETECTED   Tetrahydrocannabinol POSITIVE (A) NONE DETECTED   Barbiturates NONE DETECTED NONE DETECTED    Comment: (NOTE) DRUG SCREEN FOR MEDICAL PURPOSES ONLY.  IF CONFIRMATION IS NEEDED FOR ANY PURPOSE, NOTIFY LAB WITHIN 5 DAYS.  LOWEST DETECTABLE LIMITS FOR URINE DRUG SCREEN Drug Class                      Cutoff (ng/mL) Amphetamine and metabolites    1000 Barbiturate and metabolites    200 Benzodiazepine                 200 Opiates and metabolites        300 Cocaine and metabolites        300 THC                            50 Performed at [facility], [address], [place], [place] [address]   Comprehensive metabolic panel     Status: None   Collection Time: 07/22/22  4:30 PM  Result Value Ref Range   Sodium 137 135 - 145 mmol/L   Potassium 3.6 3.5 - 5.1 mmol/L   Chloride 103 98 - 111 mmol/L   CO2 27 22 - 32 mmol/L   Glucose, Bld 85 70 - 99 mg/dL    Comment: Glucose reference range applies only to samples taken after fasting for at least 8 hours.   BUN 6 6 - 20 mg/dL   Creatinine, Ser 6.04 0.61 - 1.24 mg/dL   Calcium 9.1 8.9 - 54.0 mg/dL   Total Protein 7.2 6.5 - 8.1 g/dL   Albumin 3.8 3.5 - 5.0 g/dL   AST 22 15 - 41 U/L   ALT 23 0 - 44 U/L   Alkaline Phosphatase 74 38 - 126 U/L   Total Bilirubin 0.7 0.3 - 1.2 mg/dL   GFR, Estimated >98 >11 mL/min    Comment: (NOTE) Calculated using the CKD-EPI Creatinine Equation (2021)    Anion gap 7 5 - 15    Comment: Performed at [facility], [address], [place], [place] [address]  Ethanol     Status: None   Collection Time: 07/22/22  4:30 PM  Result Value Ref Range   Alcohol, Ethyl (B) <10 <10 mg/dL    Comment: (NOTE) Lowest detectable limit for serum alcohol is 10 mg/dL.  For medical purposes only. Performed at [facility], [address] [place], [place] [address]   Salicylate level     Status: Abnormal   Collection Time: 07/22/22  4:30 PM  Result Value Ref Range   Salicylate Lvl <7.0 (L) 7.0 - 30.0 mg/dL    Comment: Performed at [facility], [address], [place], [place] [address]  Acetaminophen level     Status: Abnormal   Collection Time: 07/22/22  4:30 PM  Result Value Ref Range   Acetaminophen (Tylenol), Serum <10 (L) 10 - 30 ug/mL    Comment:  (NOTE) Therapeutic concentrations vary significantly. A range of 10-30 ug/mL  may be an effective concentration for many patients. However, some  are best treated at concentrations outside of this range. Acetaminophen concentrations >150 ug/mL at 4 hours after ingestion  and >50 ug/mL at 12 hours after ingestion are often associated with  toxic reactions.  Performed at [facility], [address] [place], [place] [address]   cbc     Status: None   Collection Time: 07/22/22  4:30 PM  Result Value Ref Range   WBC 7.4 4.0 - 10.5 K/uL   RBC 5.18 4.22 - 5.81 MIL/uL   Hemoglobin 14.6 13.0 - 17.0 g/dL   HCT 62.1 30.8 - 65.7 %   MCV 87.5 80.0 - 100.0 fL   MCH 28.2 26.0 - 34.0 pg   MCHC 32.2 30.0 - 36.0 g/dL   RDW 84.6 96.2 - 95.2 %   Platelets 318 150 - 400 K/uL   nRBC 0.0 0.0 - 0.2 %    Comment: Performed at [facility], [address], [place], [place] [address]    No current facility-administered medications for this encounter.   Current Outpatient Medications  Medication Sig Dispense Refill   Cetirizine HCl (ZYRTEC PO) Take by mouth.     clotrimazole (LOTRIMIN) 1 % cream Apply to affected area 2 times daily 15 g 0   ketoconazole (NIZORAL) 2 % shampoo Apply 1 Application topically 2 (two) times a week. Lather onto scalp and chest rash. Allow to sit on the skin for 5-10 minutes, then rinse off with water. Do this twice a week on Mondays and Thursdays for 4 weeks. 120 mL 0    Musculoskeletal: Strength & Muscle Tone: within normal limits Gait & Station: normal Patient leans: N/A   Psychiatric Specialty Exam: Presentation  General Appearance:  Appropriate for Environment; Fairly Groomed  Eye Contact: Good  Speech: Clear and Coherent; Normal Rate  Speech Volume: Normal  Handedness: Right   Mood and Affect  Mood: Depressed; Anxious ("Okay right now")  Affect: Other (comment) (Neutral to euthymic)   Thought Process  Thought Processes: Coherent;  Goal Directed; Linear  Descriptions of Associations:Circumstantial  Orientation:Full (Time, Place and Person)  Thought Content:Logical  History of Schizophrenia/Schizoaffective disorder:No data recorded Duration of Psychotic Symptoms:No data recorded Hallucinations:Hallucinations: None  Ideas of Reference:None  Suicidal Thoughts:Suicidal Thoughts: Yes, Passive SI Passive Intent and/or Plan: Without Intent; Without Plan; With Means to Carry Out; With Access to Means  Homicidal Thoughts:Homicidal Thoughts: Yes, Passive HI Passive Intent and/or Plan: Without Intent; Without Plan; With Means to Carry Out; With Access to Means   Sensorium  Memory: Immediate Fair; Recent Fair; Remote Fair  Judgment: Fair  Insight: Fair   Chartered certified accountant: Fair  Attention Span: Fair  Recall: [employer] of Knowledge: Fair  Language: Fair   Psychomotor Activity  Psychomotor Activity: Psychomotor Activity: Normal   Assets  Assets: Manufacturing systems engineer; Social Support; Desire for Improvement; Financial Resources/Insurance; Housing; Intimacy; Leisure Time; Physical Health; Resilience; Talents/Skills; Transportation; Vocational/Educational    Sleep  Sleep: Sleep: Poor   Physical Exam: Physical Exam Vitals and nursing note reviewed.  Constitutional:      Appearance: He is obese.  Pulmonary:     Effort: Pulmonary effort is normal.  Neurological:     Mental Status: He is alert and oriented  to person, place, and time.  Psychiatric:        Attention and Perception: He does not perceive auditory or visual hallucinations.        Mood and Affect: Mood is anxious and depressed.        Speech: Speech normal.        Behavior: Behavior is cooperative.        Thought Content: Thought content is not paranoid. Thought content includes homicidal and suicidal ideation.        Cognition and Memory: Cognition and memory normal.    Review of Systems   Constitutional:  Positive for malaise/fatigue.  Psychiatric/Behavioral:  Positive for depression, substance abuse and suicidal ideas. Negative for hallucinations. The patient is nervous/anxious and has insomnia.   All other systems reviewed and are negative.  Blood pressure (!) 163/107, pulse 77, temperature 98 F (36.7 C), temperature source Oral, resp. rate 18, height 5\' 6"  (1.676 m), SpO2 100 %. There is no height or weight on file to calculate BMI.  Medical Decision Making:  Diagnostically, the patient presents with symptomology that is most indicative at this time of a unipolar major depression recurrent episode severe, though not for certain at this time if experiencing psychotic features (pt. Currently denies and does not appear to be RTIS).  Patient at this time meets inpatient criteria and is requesting voluntary admission, however, if the patient should choose to decide to attempt to leave, he does meet [facility] IVC criteria, and should be committed.  Psychiatry team will seek disposition for the patient and follow the patient until this is obtained.  Recommendations  #MDD (major depressive disorder), recurrent severe, without psychosis (HCC) r/o w/ psychotic features   -Recommend inpatient treatment -Recommend Zoloft 50 mg p.o. daily  Disposition: Recommend psychiatric Inpatient admission when medically cleared.  Lenox Ponds, NP 07/22/2022 6:15 PM

## 2022-07-22 NOTE — ED Provider Notes (Signed)
[place] EMERGENCY DEPARTMENT AT [facility] Provider Note   CSN: [id number] Arrival date & time: 07/22/22  1620     History  Chief Complaint  Patient presents with   Suicidal    Justin Anderson is a 22 y.o. male who presents the emergency department voluntarily complaining of depression and suicidal ideation.  States he has had worsening ideation for the past week or so, and has plans to cut himself to death.  Endorses remittent alcohol use, tobacco and marijuana use daily.  HPI     Home Medications Prior to Admission medications   Medication Sig Start Date End Date Taking? Authorizing Provider  Cetirizine HCl (ZYRTEC PO) Take by mouth.    [provider]  clotrimazole (LOTRIMIN) 1 % cream Apply to affected area 2 times daily 07/09/22   Carlisle Beers, FNP  ketoconazole (NIZORAL) 2 % shampoo Apply 1 Application topically 2 (two) times a week. Lather onto scalp and chest rash. Allow to sit on the skin for 5-10 minutes, then rinse off with water. Do this twice a week on Mondays and Thursdays for 4 weeks. 07/09/22   Carlisle Beers, FNP      Allergies    Patient has no known allergies.    Review of Systems   Review of Systems  Psychiatric/Behavioral:  Positive for agitation, sleep disturbance and suicidal ideas.   All other systems reviewed and are negative.   Physical Exam Updated Vital Signs BP (!) 163/107 (BP Location: Right Arm)   Pulse 77   Temp 98 F (36.7 C) (Oral)   Resp 18   Ht 5\' 6"  (1.676 m)   SpO2 100%  Physical Exam Vitals and nursing note reviewed.  Constitutional:      Appearance: Normal appearance.     Comments: Very pleasant, cooperative  HENT:     Head: Normocephalic and atraumatic.  Eyes:     Conjunctiva/sclera: Conjunctivae normal.  Pulmonary:     Effort: Pulmonary effort is normal. No respiratory distress.  Skin:    General: Skin is warm and dry.  Neurological:     Mental Status: He is alert.  Psychiatric:         Attention and Perception: Attention and perception normal.        Mood and Affect: Mood is depressed.        Speech: Speech normal.        Behavior: Behavior normal.        Thought Content: Thought content includes homicidal and suicidal ideation. Thought content includes suicidal plan. Thought content does not include homicidal plan.     ED Results / Procedures / Treatments   Labs (all labs ordered are listed, but only abnormal results are displayed) Labs Reviewed  SALICYLATE LEVEL - Abnormal; Notable for the following components:      Result Value   Salicylate Lvl <7.0 (*)    All other components within normal limits  ACETAMINOPHEN LEVEL - Abnormal; Notable for the following components:   Acetaminophen (Tylenol), Serum <10 (*)    All other components within normal limits  RAPID URINE DRUG SCREEN, HOSP PERFORMED - Abnormal; Notable for the following components:   Tetrahydrocannabinol POSITIVE (*)    All other components within normal limits  COMPREHENSIVE METABOLIC PANEL  ETHANOL  CBC    EKG None  Radiology No results found.  Procedures Procedures    Medications Ordered in ED Medications  sertraline (ZOLOFT) tablet 50 mg (has no administration in time range)  ED Course/ Medical Decision Making/ A&P                             Medical Decision Making Amount and/or Complexity of Data Reviewed Labs: ordered.   Patient is a 22 y.o. male  who presents to the emergency department for psychiatric complaint. Patient is here voluntarily.   Past Medical History: Environmental allergies, obesity  Physical Exam: Hypertensive, otherwise normal vitals. Resting comfortably in exam bed in no acute distress. Endorses SI with plan. Endorses HI without a plan. Denies AVH.   Labs: Medical clearance labs ordered, with following pertinent results: CBC and CMP unremarkable.  Negative ethanol, Aminofen, and salicylate.  UDS positive for THC.  Disposition: Patient is  medically cleared at this time. Patient was evaluated by Arsenio Loader NP with behavioral health, who recommends inpatient psychiatric admission. Patient is here voluntarily however he understands if he tries to leave, he will be demonstrating a risk to himself and will meet criteria for involuntary commitment. Patient and staff made aware.   Final Clinical Impression(s) / ED Diagnoses Final diagnoses:  Suicidal ideation  Current severe episode of major depressive disorder without psychotic features, unspecified whether recurrent (HCC)    Rx / DC Orders ED Discharge Orders     None      Portions of this report may have been transcribed using voice recognition software. Every effort was made to ensure accuracy; however, inadvertent computerized transcription errors may be present.    Jeanella Flattery 07/22/22 1900    Ernie Avena, MD 07/22/22 2019

## 2022-07-23 ENCOUNTER — Encounter (HOSPITAL_COMMUNITY): Payer: Self-pay | Admitting: Nurse Practitioner

## 2022-07-23 ENCOUNTER — Other Ambulatory Visit: Payer: Self-pay

## 2022-07-23 ENCOUNTER — Inpatient Hospital Stay (HOSPITAL_COMMUNITY)
Admission: AD | Admit: 2022-07-23 | Discharge: 2022-07-28 | DRG: 885 | Disposition: A | Discharge status: Home / Self Care | Payer: Medicaid Other | Source: Intra-hospital | Attending: Psychiatry | Admitting: Psychiatry

## 2022-07-23 DIAGNOSIS — R630 Anorexia: Secondary | ICD-10-CM | POA: Diagnosis present

## 2022-07-23 DIAGNOSIS — F332 Major depressive disorder, recurrent severe without psychotic features: Secondary | ICD-10-CM | POA: Diagnosis not present

## 2022-07-23 DIAGNOSIS — R45851 Suicidal ideations: Secondary | ICD-10-CM | POA: Diagnosis present

## 2022-07-23 DIAGNOSIS — F322 Major depressive disorder, single episode, severe without psychotic features: Secondary | ICD-10-CM | POA: Diagnosis not present

## 2022-07-23 DIAGNOSIS — F10139 Alcohol abuse with withdrawal, unspecified: Secondary | ICD-10-CM | POA: Diagnosis present

## 2022-07-23 DIAGNOSIS — F17290 Nicotine dependence, other tobacco product, uncomplicated: Secondary | ICD-10-CM | POA: Diagnosis present

## 2022-07-23 DIAGNOSIS — F1010 Alcohol abuse, uncomplicated: Secondary | ICD-10-CM | POA: Insufficient documentation

## 2022-07-23 DIAGNOSIS — Z634 Disappearance and death of family member: Secondary | ICD-10-CM

## 2022-07-23 DIAGNOSIS — F1210 Cannabis abuse, uncomplicated: Secondary | ICD-10-CM | POA: Insufficient documentation

## 2022-07-23 DIAGNOSIS — F12159 Cannabis abuse with psychotic disorder, unspecified: Secondary | ICD-10-CM | POA: Diagnosis present

## 2022-07-23 DIAGNOSIS — Z9152 Personal history of nonsuicidal self-harm: Secondary | ICD-10-CM

## 2022-07-23 DIAGNOSIS — Z87820 Personal history of traumatic brain injury: Secondary | ICD-10-CM | POA: Diagnosis not present

## 2022-07-23 DIAGNOSIS — I10 Essential (primary) hypertension: Secondary | ICD-10-CM | POA: Diagnosis present

## 2022-07-23 DIAGNOSIS — F329 Major depressive disorder, single episode, unspecified: Secondary | ICD-10-CM | POA: Insufficient documentation

## 2022-07-23 DIAGNOSIS — F19959 Other psychoactive substance use, unspecified with psychoactive substance-induced psychotic disorder, unspecified: Secondary | ICD-10-CM | POA: Insufficient documentation

## 2022-07-23 MED ORDER — NICOTINE POLACRILEX 2 MG MT GUM: 2.0000 mg | CHEWING_GUM | OROMUCOSAL | Status: DC | PRN

## 2022-07-23 MED ORDER — HALOPERIDOL LACTATE 5 MG/ML IJ SOLN: 5.0000 mg | Freq: Three times a day (TID) | INTRAMUSCULAR | Status: DC | PRN

## 2022-07-23 MED ORDER — TRAZODONE HCL 50 MG PO TABS
50.0000 mg | ORAL_TABLET | Freq: Every evening | ORAL | Status: DC | PRN
  Administered 2022-07-24 – 2022-07-26 (×2): 50 mg via ORAL
  Filled 2022-07-23 (×2): qty 1

## 2022-07-23 MED ORDER — LORAZEPAM 2 MG/ML IJ SOLN: 2.0000 mg | Freq: Three times a day (TID) | INTRAMUSCULAR | Status: DC | PRN

## 2022-07-23 MED ORDER — HYDROXYZINE HCL 25 MG PO TABS: 25.0000 mg | ORAL_TABLET | Freq: Three times a day (TID) | ORAL | Status: DC | PRN

## 2022-07-23 MED ORDER — ACETAMINOPHEN 325 MG PO TABS
650.0000 mg | ORAL_TABLET | Freq: Four times a day (QID) | ORAL | Status: DC | PRN
  Administered 2022-07-28: 650 mg via ORAL
  Filled 2022-07-23: qty 2

## 2022-07-23 MED ORDER — MAGNESIUM HYDROXIDE 400 MG/5ML PO SUSP: 30.0000 mL | Freq: Every day | ORAL | Status: DC | PRN

## 2022-07-23 MED ORDER — ALUM & MAG HYDROXIDE-SIMETH 200-200-20 MG/5ML PO SUSP: 30.0000 mL | ORAL | Status: DC | PRN

## 2022-07-23 MED ORDER — HALOPERIDOL 5 MG PO TABS: 5.0000 mg | ORAL_TABLET | Freq: Three times a day (TID) | ORAL | Status: DC | PRN

## 2022-07-23 MED ORDER — LORAZEPAM 1 MG PO TABS: 2.0000 mg | ORAL_TABLET | Freq: Three times a day (TID) | ORAL | Status: DC | PRN

## 2022-07-23 MED ORDER — SERTRALINE HCL 50 MG PO TABS
50.0000 mg | ORAL_TABLET | Freq: Every day | ORAL | Status: DC
  Administered 2022-07-24 – 2022-07-28 (×5): 50 mg via ORAL
  Filled 2022-07-23 (×8): qty 1

## 2022-07-23 NOTE — ED Notes (Signed)
Pt calling his father at this time.

## 2022-07-23 NOTE — ED Notes (Addendum)
Notified pt that his father and grandmother would like a phone call. Pt does not wish to call family at this time.

## 2022-07-23 NOTE — Progress Notes (Signed)
[facility] Psych ED Progress Note  07/23/2022 11:11 AM Justin Anderson  MRN:  [id number]   Subjective:   Patient seen this morning at [facility] for face to face psychiatric reevaluation. Patient is pleasant, states he does feel some improvement today vs yesterday. He reports sleeping the best he has slept in a while last night, which he assumed is why he feels relief today. Pt continues to endorse some passive SI, but no plan or intent. Denies HI. Denies AVH. Patient is still willing to receive voluntary IP treatment. Will continue to recommend IP at this time for further stabilization and medication management.    Principal Problem: MDD (major depressive disorder), recurrent severe, without psychosis (HCC) Diagnosis:  Principal Problem:   MDD (major depressive disorder), recurrent severe, without psychosis (HCC)   ED Assessment Time Calculation: Start Time: 0900 Stop Time: 0930 Total Time in Minutes (Assessment Completion): 30   [place] Scale:  Flowsheet Row ED from 07/22/2022 in [facility] Emergency Department at [facility] ED from 07/09/2022 in [facility] at [facility])  C-SSRS RISK CATEGORY High Risk No Risk       Past Medical History:  Past Medical History:  Diagnosis Date   Environmental allergies    History reviewed. No pertinent surgical history. Family History:  Family History  Problem Relation Age of Onset   Hypertension Mother    Diabetes type II Father    Social History:  Social History   Substance and Sexual Activity  Alcohol Use Not Currently   Alcohol/week: 4.0 standard drinks of alcohol   Types: 4 Shots of liquor per week     Social History   Substance and Sexual Activity  Drug Use Yes   Types: Marijuana    Social History   Socioeconomic History   Marital status: Single    Spouse name: Not on file   Number of children: Not on file   Years of education: Not on file   Highest education level: Not on file  Occupational  History   Not on file  Tobacco Use   Smoking status: Every Day    Types: Cigars   Smokeless tobacco: Never  Vaping Use   Vaping Use: Never used  Substance and Sexual Activity   Alcohol use: Not Currently    Alcohol/week: 4.0 standard drinks of alcohol    Types: 4 Shots of liquor per week   Drug use: Yes    Types: Marijuana   Sexual activity: Not on file  Other Topics Concern   Not on file  Social History Narrative   Not on file   Social Determinants of Health   Financial Resource Strain: Not on file  Food Insecurity: Not on file  Transportation Needs: Not on file  Physical Activity: Not on file  Stress: Not on file  Social Connections: Not on file    Sleep: Fair  Appetite:  Good  Current Medications: Current Facility-Administered Medications  Medication Dose Route Frequency Provider Last Rate Last Admin   sertraline (ZOLOFT) tablet 50 mg  50 mg Oral Daily Lenox Ponds, NP   50 mg at 07/23/22 1032   Current Outpatient Medications  Medication Sig Dispense Refill   ketoconazole (NIZORAL) 2 % shampoo Apply 1 Application topically 2 (two) times a week. Lather onto scalp and chest rash. Allow to sit on the skin for 5-10 minutes, then rinse off with water. Do this twice a week on Mondays and Thursdays for 4 weeks. 120 mL 0  Cetirizine HCl (ZYRTEC PO) Take by mouth. (Patient not taking: Reported on 07/23/2022)     clotrimazole (LOTRIMIN) 1 % cream Apply to affected area 2 times daily (Patient not taking: Reported on 07/23/2022) 15 g 0    Lab Results:  Results for orders placed or performed during the hospital encounter of 07/22/22 (from the past 48 hour(s))  Rapid urine drug screen (hospital performed)     Status: Abnormal   Collection Time: 07/22/22  4:20 PM  Result Value Ref Range   Opiates NONE DETECTED NONE DETECTED   Cocaine NONE DETECTED NONE DETECTED   Benzodiazepines NONE DETECTED NONE DETECTED   Amphetamines NONE DETECTED NONE DETECTED   Tetrahydrocannabinol  POSITIVE (A) NONE DETECTED   Barbiturates NONE DETECTED NONE DETECTED    Comment: (NOTE) DRUG SCREEN FOR MEDICAL PURPOSES ONLY.  IF CONFIRMATION IS NEEDED FOR ANY PURPOSE, NOTIFY LAB WITHIN 5 DAYS.  LOWEST DETECTABLE LIMITS FOR URINE DRUG SCREEN Drug Class                     Cutoff (ng/mL) Amphetamine and metabolites    1000 Barbiturate and metabolites    200 Benzodiazepine                 200 Opiates and metabolites        300 Cocaine and metabolites        300 THC                            50 Performed at [facility] Lab, [address], [place], [place] [address]   Comprehensive metabolic panel     Status: None   Collection Time: 07/22/22  4:30 PM  Result Value Ref Range   Sodium 137 135 - 145 mmol/L   Potassium 3.6 3.5 - 5.1 mmol/L   Chloride 103 98 - 111 mmol/L   CO2 27 22 - 32 mmol/L   Glucose, Bld 85 70 - 99 mg/dL    Comment: Glucose reference range applies only to samples taken after fasting for at least 8 hours.   BUN 6 6 - 20 mg/dL   Creatinine, Ser 4.78 0.61 - 1.24 mg/dL   Calcium 9.1 8.9 - 29.5 mg/dL   Total Protein 7.2 6.5 - 8.1 g/dL   Albumin 3.8 3.5 - 5.0 g/dL   AST 22 15 - 41 U/L   ALT 23 0 - 44 U/L   Alkaline Phosphatase 74 38 - 126 U/L   Total Bilirubin 0.7 0.3 - 1.2 mg/dL   GFR, Estimated >62 >13 mL/min    Comment: (NOTE) Calculated using the CKD-EPI Creatinine Equation (2021)    Anion gap 7 5 - 15    Comment: Performed at [facility] Lab, [address], [place], [place] [address]  Ethanol     Status: None   Collection Time: 07/22/22  4:30 PM  Result Value Ref Range   Alcohol, Ethyl (B) <10 <10 mg/dL    Comment: (NOTE) Lowest detectable limit for serum alcohol is 10 mg/dL.  For medical purposes only. Performed at [facility] Lab, [address] [place], [place] [address]   Salicylate level     Status: Abnormal   Collection Time: 07/22/22  4:30 PM  Result Value Ref Range   Salicylate Lvl <7.0 (L) 7.0 - 30.0 mg/dL    Comment:  Performed at [facility] Lab, [address], [place], [place] [address]  Acetaminophen level     Status: Abnormal   Collection Time: 07/22/22  4:30 PM  Result Value Ref Range   Acetaminophen (Tylenol), Serum <10 (L) 10 - 30 ug/mL    Comment: (NOTE) Therapeutic concentrations vary significantly. A range of 10-30 ug/mL  may be an effective concentration for many patients. However, some  are best treated at concentrations outside of this range. Acetaminophen concentrations >150 ug/mL at 4 hours after ingestion  and >50 ug/mL at 12 hours after ingestion are often associated with  toxic reactions.  Performed at [facility] Lab, [address], [place], [place] [address]   cbc     Status: None   Collection Time: 07/22/22  4:30 PM  Result Value Ref Range   WBC 7.4 4.0 - 10.5 K/uL   RBC 5.18 4.22 - 5.81 MIL/uL   Hemoglobin 14.6 13.0 - 17.0 g/dL   HCT 95.2 84.1 - 32.4 %   MCV 87.5 80.0 - 100.0 fL   MCH 28.2 26.0 - 34.0 pg   MCHC 32.2 30.0 - 36.0 g/dL   RDW 40.1 02.7 - 25.3 %   Platelets 318 150 - 400 K/uL   nRBC 0.0 0.0 - 0.2 %    Comment: Performed at [facility] Lab, [address], [place], [place] [address]    Blood Alcohol level:  Lab Results  Component Value Date   [facility] <10 07/22/2022   Psychiatric Specialty Exam:  Presentation  General Appearance:  Appropriate for Environment  Eye Contact: Good  Speech: Clear and Coherent  Speech Volume: Normal  Handedness: Right   Mood and Affect  Mood: Anxious  Affect: Congruent   Thought Process  Thought Processes: Coherent  Descriptions of Associations:Intact  Orientation:Full (Time, Place and Person)  Thought Content:Logical  History of Schizophrenia/Schizoaffective disorder:No data recorded Duration of Psychotic Symptoms:No data recorded Hallucinations:Hallucinations: None  Ideas of Reference:None  Suicidal Thoughts:Suicidal Thoughts: Yes, Passive SI Passive Intent and/or Plan: Without  Intent; Without Plan  Homicidal Thoughts:Homicidal Thoughts: No HI Passive Intent and/or Plan: Without Intent; Without Plan; With Means to Carry Out; With Access to Means   Sensorium  Memory: Immediate Fair; Recent Fair  Judgment: Fair  Insight: Fair   Art therapist  Concentration: Good  Attention Span: Good  Recall: Good  Fund of Knowledge: Good  Language: Good   Psychomotor Activity  Psychomotor Activity: Psychomotor Activity: Normal   Assets  Assets: Desire for Improvement; Physical Health; Resilience; Social Support   Sleep  Sleep: Sleep: Fair    Physical Exam: Physical Exam Neurological:     Mental Status: He is alert and oriented to person, place, and time.  Psychiatric:        Attention and Perception: Attention normal.        Mood and Affect: Mood is anxious.        Speech: Speech normal.        Behavior: Behavior is cooperative.        Thought Content: Thought content includes suicidal ideation.    Review of Systems  Psychiatric/Behavioral:  Positive for depression and substance abuse.   All other systems reviewed and are negative.  Blood pressure 135/77, pulse 64, temperature 98.2 F (36.8 C), temperature source Oral, resp. rate 16, height 5\' 6"  (1.676 m), weight 99.8 kg, SpO2 95 %. Body mass index is 35.51 kg/m.   Medical Decision Making: Patient case reviewed and discussed with Dr. Lucianne Muss. Will continue to recommend inpatient psychiatric treatment. Pt currently being reviewed at [facility],  will have CSW fax out if no availability.   No medication changes  Eligha Bridegroom, NP 07/23/2022, 11:11 AM

## 2022-07-23 NOTE — Tx Team (Signed)
Initial Treatment Plan 07/23/2022 11:13 PM [name] ZOX:[id number]    PATIENT STRESSORS: Loss of parent   Marital or family conflict     PATIENT STRENGTHS: Motivation for treatment/growth  Supportive family/friends    PATIENT IDENTIFIED PROBLEMS: Unresolved Grief  Panic attacks                   DISCHARGE CRITERIA:  Improved stabilization in mood, thinking, and/or behavior Motivation to continue treatment in a less acute level of care Reduction of life-threatening or endangering symptoms to within safe limits Verbal commitment to aftercare and medication compliance  PRELIMINARY DISCHARGE PLAN: Return to previous living arrangement Return to previous work or school arrangements  PATIENT/FAMILY INVOLVEMENT: This treatment plan has been presented to and reviewed with the patient, Justin Anderson. The patient has been given the opportunity to ask questions and make suggestions.  Marja Kays, RN 07/23/2022, 11:13 PM

## 2022-07-23 NOTE — ED Notes (Signed)
Pt's father called back requesting phone call from pt. Pt now doesn't wish to talk to family.

## 2022-07-23 NOTE — Progress Notes (Signed)
Pt was accepted to [facility] TODAY 07/23/2022. Bed assignment: 406-2  Pt meets inpatient criteria per Eligha Bridegroom, NP  Attending Physician will be Phineas Inches, MD  Report can be called to: - Adult unit: [phone]  Pt can arrive after 8 PM  Care Team Notified: [facility] [place], RN, Denton Ar, RN, and Eligha Bridegroom, NP  Cathie Beams, [place]  07/23/2022 2:14 PM

## 2022-07-24 DIAGNOSIS — F1210 Cannabis abuse, uncomplicated: Secondary | ICD-10-CM | POA: Insufficient documentation

## 2022-07-24 DIAGNOSIS — F19959 Other psychoactive substance use, unspecified with psychoactive substance-induced psychotic disorder, unspecified: Secondary | ICD-10-CM | POA: Insufficient documentation

## 2022-07-24 DIAGNOSIS — F1010 Alcohol abuse, uncomplicated: Secondary | ICD-10-CM | POA: Insufficient documentation

## 2022-07-24 MED ORDER — LORAZEPAM 2 MG/ML IJ SOLN: 2.0000 mg | Freq: Three times a day (TID) | INTRAMUSCULAR | Status: DC | PRN

## 2022-07-24 MED ORDER — ONDANSETRON 4 MG PO TBDP: 4.0000 mg | ORAL_TABLET | Freq: Four times a day (QID) | ORAL | Status: AC | PRN

## 2022-07-24 MED ORDER — ADULT MULTIVITAMIN W/MINERALS CH
1.0000 | ORAL_TABLET | Freq: Every day | ORAL | Status: DC
  Administered 2022-07-24 – 2022-07-28 (×5): 1 via ORAL
  Filled 2022-07-24 (×9): qty 1

## 2022-07-24 MED ORDER — THIAMINE HCL 100 MG/ML IJ SOLN
100.0000 mg | Freq: Once | INTRAMUSCULAR | Status: AC
  Administered 2022-07-24: 100 mg via INTRAMUSCULAR
  Filled 2022-07-24: qty 2

## 2022-07-24 MED ORDER — DIPHENHYDRAMINE HCL 50 MG/ML IJ SOLN: 50.0000 mg | Freq: Three times a day (TID) | INTRAMUSCULAR | Status: DC | PRN

## 2022-07-24 MED ORDER — LORAZEPAM 1 MG PO TABS: 2.0000 mg | ORAL_TABLET | Freq: Three times a day (TID) | ORAL | Status: DC | PRN

## 2022-07-24 MED ORDER — LORAZEPAM 1 MG PO TABS: 1.0000 mg | ORAL_TABLET | Freq: Four times a day (QID) | ORAL | Status: AC | PRN

## 2022-07-24 MED ORDER — HYDROXYZINE HCL 25 MG PO TABS
25.0000 mg | ORAL_TABLET | Freq: Four times a day (QID) | ORAL | Status: AC | PRN
  Administered 2022-07-24: 25 mg via ORAL
  Filled 2022-07-24: qty 1

## 2022-07-24 MED ORDER — CLONIDINE HCL 0.1 MG PO TABS
0.1000 mg | ORAL_TABLET | ORAL | Status: DC | PRN
  Administered 2022-07-24 – 2022-07-27 (×4): 0.1 mg via ORAL
  Filled 2022-07-24 (×4): qty 1

## 2022-07-24 MED ORDER — HALOPERIDOL 5 MG PO TABS: 5.0000 mg | ORAL_TABLET | Freq: Three times a day (TID) | ORAL | Status: DC | PRN

## 2022-07-24 MED ORDER — LOPERAMIDE HCL 2 MG PO CAPS
2.0000 mg | ORAL_CAPSULE | ORAL | Status: AC | PRN
  Administered 2022-07-24 (×2): 2 mg via ORAL
  Filled 2022-07-24 (×2): qty 1

## 2022-07-24 MED ORDER — RISPERIDONE 1 MG PO TABS
1.0000 mg | ORAL_TABLET | Freq: Every day | ORAL | Status: DC
  Administered 2022-07-24: 1 mg via ORAL
  Filled 2022-07-24 (×3): qty 1

## 2022-07-24 MED ORDER — VITAMIN B-1 100 MG PO TABS
100.0000 mg | ORAL_TABLET | Freq: Every day | ORAL | Status: DC
  Administered 2022-07-25 – 2022-07-28 (×4): 100 mg via ORAL
  Filled 2022-07-24 (×7): qty 1

## 2022-07-24 MED ORDER — HALOPERIDOL LACTATE 5 MG/ML IJ SOLN: 5.0000 mg | Freq: Three times a day (TID) | INTRAMUSCULAR | Status: DC | PRN

## 2022-07-24 MED ORDER — DIPHENHYDRAMINE HCL 25 MG PO CAPS: 50.0000 mg | ORAL_CAPSULE | Freq: Three times a day (TID) | ORAL | Status: DC | PRN

## 2022-07-24 NOTE — BHH Suicide Risk Assessment (Signed)
[facility] Admission Suicide Risk Assessment   Nursing information obtained from:  Patient Demographic factors:  Male, Adolescent or young adult Current Mental Status:  NA Loss Factors:  NA Historical Factors:  NA Risk Reduction Factors:  Positive social support, Living with another person, especially a relative  Total Time spent with patient: 45 minutes Principal Problem: MDD (major depressive disorder), recurrent severe, without psychosis (HCC) Diagnosis:  Principal Problem:   MDD (major depressive disorder), recurrent severe, without psychosis (HCC) Active Problems:   Psychoactive substance-induced psychosis (HCC)   Cannabis abuse   Alcohol abuse  Subjective Data:  History of Present Illness:  Justin Anderson is a 22 y.o., male who denies having any formally diagnosed past psychiatric history, who was admitted to the [facility] from [facility] ED, for evaluation and management of suicidal ideation, homicidal thoughts, auditory hallucinations, paranoia, and agitation. He was transferred voluntarily from the ED to the [facility].    Current outpatient psych meds prior to admission: None   On initial assessment today, the patient was evaluated on the inpatient unit. The patient reports for the last month overall psychiatric decompensation. He specifically identifies worsening anger, lashing out and being more irritated.  He also reports feeling very down depressed, sad, poor sleep, poor appetite, poor concentration, and having intense suicidal thoughts for some time (pt vague about onset of SI) but did report thinking to cut his throat to kill himself about 2 weeks ago, but instead cut his left forearm.  He reports psychosis, in particular AH, paranoia over the last month or so as well. Stressors causing psychiatric decompensation that he identifies include: feeling overwhelmed, cannabis use, alcohol use.  Overall, the timeline of psychotic symptoms aligns with substance use. Depressive  symptoms appear to occur outside of substance use.    The pt reports having AH for about 1 month -  hearing male and male voices, hearing random sounds, and describes that he feels he is being watched.  He does not know what the voices are saying.  The symptoms have occurred over the last several days and he does not endorse any previous history of these. He says that there is a twitch behind his eye that allows him to "feel the vibes in the room" and ringing in his ears that helps him realize that he is being watched. Denies VH, thought control, thought insertion.    He endorses symptoms of depression, sadness, mood instability, sleep disturbance, loss of interest in activities, guilt/worthlessness, lack of energy, lack of concentration, and passive suicidal ideation.  He does not provide a timeline of these symptoms.  At this time, he denies having any SI. Denies having any current HI. He does not have any firearms at the house.   Pt is vague about anxiety symptoms but does state that he is on edge at baseline. Denies having any panic attacks.    The patient denies past or current symptoms of mania/hypomania, including symptoms of: grandiosity and inflated self-esteem, decreased need for sleep, pressured speech, flight of ideas and racing thoughts, distractibility or inattention, risk-taking activities, and denies impulse to participate in activities that have dangerous, harmful, or painful potential consequences.    Continued Clinical Symptoms:  Alcohol Use Disorder Identification Test Final Score (AUDIT): 4 The "Alcohol Use Disorders Identification Test", Guidelines for Use in Primary Care, Second Edition.  World Science writer [facility]). Score between 0-7:  no or low risk or alcohol related problems. Score between 8-15:  moderate risk of alcohol related problems.  Score between 16-19:  high risk of alcohol related problems. Score 20 or above:  warrants further diagnostic evaluation for alcohol  dependence and treatment.   CLINICAL FACTORS:   Severe Anxiety and/or Agitation Depression:   Delusional Hopelessness Impulsivity Insomnia Severe Alcohol/Substance Abuse/Dependencies More than one psychiatric diagnosis Currently Psychotic Previous Psychiatric Diagnoses and Treatments     Psychiatric Specialty Exam:  Presentation  General Appearance:  Appropriate for Environment  Eye Contact: Good  Speech: Normal Rate  Speech Volume: Normal  Handedness: Right   Mood and Affect  Mood: Euthymic (incongruent with depressive symptoms at presentation)  Affect: Non-Congruent   Thought Process  Thought Processes: Disorganized  Descriptions of Associations:Circumstantial  Orientation:Full (Time, Place and Person) (Not formally tested)  Thought Content:Paranoid Ideation; Illogical  History of Schizophrenia/Schizoaffective disorder:No data recorded Duration of Psychotic Symptoms:No data recorded Hallucinations:Hallucinations: Auditory; Other (comment) Description of Auditory Hallucinations: Twitching behind the eyes  Ideas of Reference:None  Suicidal Thoughts:Suicidal Thoughts: Yes, Passive SI Passive Intent and/or Plan: Without Plan; Without Intent  Homicidal Thoughts:Homicidal Thoughts: No   Sensorium  Memory: Immediate Fair; Recent Fair  Judgment: Fair  Insight: Fair   Executive Functions  Concentration: Good  Attention Span: Good  Recall: Good  Fund of Knowledge: Good  Language: Good   Psychomotor Activity  Psychomotor Activity: Psychomotor Activity: Normal   Assets  Assets: Desire for Improvement; Physical Health; Resilience; Social Support   Sleep  Sleep: Sleep: Fair    Physical Exam: Physical Exam See H&P  ROS See H&P  Blood pressure 138/87, pulse 77, temperature 98.5 F (36.9 C), temperature source Oral, resp. rate 16, height 5\' 8"  (1.727 m), weight 107.2 kg, SpO2 99 %. Body mass index is 35.94  kg/m.   COGNITIVE FEATURES THAT CONTRIBUTE TO RISK:  None    SUICIDE RISK:   Moderate:  Frequent suicidal ideation with limited intensity, and duration, some specificity in terms of plans, no associated intent, good self-control, limited dysphoria/symptomatology, some risk factors present, and identifiable protective factors, including available and accessible social support.  PLAN OF CARE: See H&P   I certify that inpatient services furnished can reasonably be expected to improve the patient's condition.   Cristy Hilts, MD 07/24/2022, 4:52 PM

## 2022-07-24 NOTE — H&P (Signed)
Psychiatric Admission Assessment Adult  Patient Identification: Justin Anderson MRN:  [id number] Date of Evaluation:  07/24/2022  Chief Complaint:  MDD (major depressive disorder) [F32.9]  History of Present Illness:  Justin Anderson is a 22 y.o., male who denies having any formally diagnosed past psychiatric history, who was admitted to the [facility] from [facility] ED, for evaluation and management of suicidal ideation, homicidal thoughts, auditory hallucinations, paranoia, and agitation. He was transferred voluntarily from the ED to the [facility].   Current outpatient psych meds prior to admission: None  On initial assessment today, the patient was evaluated on the inpatient unit. The patient reports for the last month overall psychiatric decompensation. He specifically identifies worsening anger, lashing out and being more irritated.  He also reports feeling very down depressed, sad, poor sleep, poor appetite, poor concentration, and having intense suicidal thoughts for some time (pt vague about onset of SI) but did report thinking to cut his throat to kill himself about 2 weeks ago, but instead cut his left forearm.  He reports psychosis, in particular AH, paranoia over the last month or so as well. Stressors causing psychiatric decompensation that he identifies include: feeling overwhelmed, cannabis use, alcohol use.  Overall, the timeline of psychotic symptoms aligns with substance use. Depressive symptoms appear to occur outside of substance use.   The pt reports having AH for about 1 month -  hearing male and male voices, hearing random sounds, and describes that he feels he is being watched.  He does not know what the voices are saying.  The symptoms have occurred over the last several days and he does not endorse any previous history of these. He says that there is a twitch behind his eye that allows him to "feel the vibes in the room" and ringing in his ears that helps him  realize that he is being watched. Denies VH, thought control, thought insertion.   He endorses symptoms of depression, sadness, mood instability, sleep disturbance, loss of interest in activities, guilt/worthlessness, lack of energy, lack of concentration, and passive suicidal ideation.  He does not provide a timeline of these symptoms.  At this time, he denies having any SI. Denies having any current HI. He does not have any firearms at the house.  Pt is vague about anxiety symptoms but does state that he is on edge at baseline. Denies having any panic attacks.   The patient denies past or current symptoms of mania/hypomania, including symptoms of: grandiosity and inflated self-esteem, decreased need for sleep, pressured speech, flight of ideas and racing thoughts, distractibility or inattention, risk-taking activities, and denies impulse to participate in activities that have dangerous, harmful, or painful potential consequences.     Collateral information: Justin Anderson [phone]): She confirmed that he lives with him and knows him well.  [name] became aware of his current condition last Friday.  He was at his friend's house when she was notified that he was becoming increasingly agitated, talking to himself, threatening to harm self, and not eating and sleeping per his usual.  She knew that he was not himself and that he needed to get the hospital and prompted the visit to the emergency department. She clarified that he had lost his mother when he was in his teenage years and became withdrawn and different following this, as expected.  He started using alcohol around this time.  She was unaware that he used cannabis and cannot give a timeline to his use.  She  reports that this is the first episode in which she has had these symptoms but she insists that he has acted different ever since the death of his mother. She confirmed that when [name] is stable for discharge that she is happy to  pick him up, have him stay with her, and help him with any follow-up appointments he may have.    Past Psychiatric History:  Prior Psychiatric diagnoses: None Past Psychiatric Hospitalizations: None  History of self mutilation: 1 episode of self-harm by cutting (2 weeks ago) Past suicide attempts: None Past history of HI, violent or aggressive behavior: None  Past Psychiatric medications trials: None History of ECT/TMS: None  Outpatient psychiatric Follow up: None Prior Outpatient Therapy: None   Is the patient at risk to self? Yes.    Has the patient been a risk to self in the past 6 months? Yes.    Has the patient been a risk to self within the distant past? Yes.    Is the patient a risk to others? no Has the patient been a risk to others in the past 6 months? No.  Has the patient been a risk to others within the distant past? No.    Substance Use History: Alcohol: Drinks 4-5 shots daily usually liquor Tobacoo: Used to Vape but quit for health reasons.  He still smokes black n milds occasionally. Marijuana: Smokes 0.5 g daily for many years.  Delta 8 Cocaine: None Stimulants: None IV drug use: None Opiates: Short episode in the past but had insight to stop before progression of use. Prescribed Meds abuse: None H/O withdrawals, blackouts, DTs: None History of Detox / Rehab: None DUI: None   Alcohol Screening: 1. How often do you have a drink containing alcohol?: 2 to 4 times a month 2. How many drinks containing alcohol do you have on a typical day when you are drinking?: 3 or 4 3. How often do you have six or more drinks on one occasion?: Less than monthly AUDIT-C Score: 4 4. How often during the last year have you found that you were not able to stop drinking once you had started?: Never 5. How often during the last year have you failed to do what was normally expected from you because of drinking?: Never 6. How often during the last year have you needed a first  drink in the morning to get yourself going after a heavy drinking session?: Never 7. How often during the last year have you had a feeling of guilt of remorse after drinking?: Never 8. How often during the last year have you been unable to remember what happened the night before because you had been drinking?: Never 9. Have you or someone else been injured as a result of your drinking?: No 10. Has a relative or friend or a doctor or another health worker been concerned about your drinking or suggested you cut down?: No Alcohol Use Disorder Identification Test Final Score (AUDIT): 4 Alcohol Brief Interventions/Follow-up: Alcohol education/Brief advice  Substance Abuse History in the last 12 months:  Yes.     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  No seizure history.   Past Medical History:  Diagnosis Date   Environmental allergies    History reviewed. No pertinent surgical history.  Family History:  Family History  Problem Relation Age of Onset   Hypertension Mother  Diabetes type II Father     Family Psychiatric History:  Psychiatric illness: None Suicide: None Substance Abuse: None  Social History:  Social History   Substance and Sexual Activity  Alcohol Use Yes   Alcohol/week: 4.0 standard drinks of alcohol   Types: 4 Shots of liquor per week     Social History   Substance and Sexual Activity  Drug Use Yes   Types: Marijuana    Living situation: Lives with his grandma. Social support: Friends and family.  Marital Status: Had a fianc but they split up 1 year ago.  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 Military service: None Legal history: None Trauma: lost mother but unclear at what age Access to guns: None   Allergies: No known drug  allergies. Allergies  Allergen Reactions   Pollen Extract     Lab Results:  No results found for this or any previous visit (from the past 48 hour(s)).   Blood Alcohol level:  Lab Results  Component Value Date   ETH <10 07/22/2022    Metabolic Disorder Labs:  No results found for: "HGBA1C", "MPG" No results found for: "PROLACTIN" No results found for: "CHOL", "TRIG", "HDL", "CHOLHDL", "VLDL", "LDLCALC"  Current Medications: Current Facility-Administered Medications  Medication Dose Route Frequency Provider Last Rate Last Admin   acetaminophen (TYLENOL) tablet 650 mg  650 mg Oral Q6H PRN Eligha Bridegroom, NP       alum & mag hydroxide-simeth (MAALOX/MYLANTA) 200-200-20 MG/5ML suspension 30 mL  30 mL Oral Q4H PRN Eligha Bridegroom, NP       cloNIDine (CATAPRES) tablet 0.1 mg  0.1 mg Oral Q4H PRN Lashonne Shull, Harrold Donath, MD   0.1 mg at 07/24/22 1610   haloperidol (HALDOL) tablet 5 mg  5 mg Oral TID PRN Phineas Inches, MD       And   LORazepam (ATIVAN) tablet 2 mg  2 mg Oral TID PRN Phineas Inches, MD       And   diphenhydrAMINE (BENADRYL) capsule 50 mg  50 mg Oral TID PRN Brenya Taulbee, Harrold Donath, MD       haloperidol lactate (HALDOL) injection 5 mg  5 mg Intramuscular TID PRN Mayci Haning, Harrold Donath, MD       And   LORazepam (ATIVAN) injection 2 mg  2 mg Intramuscular TID PRN Aleigha Gilani, Harrold Donath, MD       And   diphenhydrAMINE (BENADRYL) injection 50 mg  50 mg Intramuscular TID PRN Claramae Rigdon, Harrold Donath, MD       hydrOXYzine (ATARAX) tablet 25 mg  25 mg Oral Q6H PRN Meryl Dare, MD       loperamide (IMODIUM) capsule 2-4 mg  2-4 mg Oral PRN Meryl Dare, MD   2 mg at 07/24/22 1401   LORazepam (ATIVAN) tablet 1 mg  1 mg Oral Q6H PRN Meryl Dare, MD       magnesium hydroxide (MILK OF MAGNESIA) suspension 30 mL  30 mL Oral Daily PRN Eligha Bridegroom, NP       multivitamin with minerals tablet 1 tablet  1 tablet Oral Daily Meryl Dare, MD   1 tablet at 07/24/22 1401   nicotine  polacrilex (NICORETTE) gum 2 mg  2 mg Oral PRN Bobbitt, Shalon E, NP       ondansetron (ZOFRAN-ODT) disintegrating tablet 4 mg  4 mg Oral Q6H PRN Meryl Dare, MD       risperiDONE (RISPERDAL) tablet 1 mg  1 mg Oral QHS Remie Mathison, Harrold Donath,  MD       sertraline (ZOLOFT) tablet 50 mg  50 mg Oral Daily Eligha Bridegroom, NP   50 mg at 07/24/22 0749   [START ON 07/25/2022] thiamine (Vitamin B-1) tablet 100 mg  100 mg Oral Daily Meryl Dare, MD       traZODone (DESYREL) tablet 50 mg  50 mg Oral QHS PRN Eligha Bridegroom, NP        PTA Medications: Medications Prior to Admission  Medication Sig Dispense Refill Last Dose   Cetirizine HCl (ZYRTEC PO) Take by mouth. (Patient not taking: Reported on 07/23/2022)      clotrimazole (LOTRIMIN) 1 % cream Apply to affected area 2 times daily (Patient not taking: Reported on 07/23/2022) 15 g 0    ketoconazole (NIZORAL) 2 % shampoo Apply 1 Application topically 2 (two) times a week. Lather onto scalp and chest rash. Allow to sit on the skin for 5-10 minutes, then rinse off with water. Do this twice a week on Mondays and Thursdays for 4 weeks. 120 mL 0     Musculoskeletal: Strength & Muscle Tone: within normal limits Gait & Station: normal Patient leans: N/A   Physical Findings: AIMS:  , ,  ,  ,    CIWA:  CIWA-Ar Total: 4 COWS:     Psychiatric Specialty Exam:  General Appearance: Florian appears at stated age, fairly dressed and groomed  Behavior: cooperative  Psychomotor Activity:No psychomotor agitation or retardation noted   Eye Contact: Fair Speech: Normal amount, tone, volume, latency   Mood: labile  Affect: Incongruent with presenting depressive symptoms.  Thought Process: Circumstantial Descriptions of Associations: Intact Thought Content: Hallucinations: Auditory hallucinations of men and women.  Unclear what they are saying.  Random sounds as well.  ringing in his ear when he is being watched. Delusions: Paranoia that he is being  watched. Suicidal Thoughts: Passive SI without intention or plan on admission. Denying any SI now.  Homicidal Thoughts: None  Alertness/Orientation: Appears alert alert and oriented but not formally assessed.  Insight: poor Judgment: poor  Memory: poor  Chartered certified accountant: poor  Attention Span: Fair Recall: poor Fund of Knowledge: Fair   Physical Exam: See special psychiatric exam. Physical Exam Vitals reviewed.  Constitutional:      General: He is not in acute distress.    Appearance: He is normal weight. He is not toxic-appearing.  Pulmonary:     Effort: Pulmonary effort is normal. No respiratory distress.  Neurological:     Mental Status: He is alert.     Motor: No weakness.     Gait: Gait normal.    Review of Systems  Constitutional:  Negative for chills and fever.  Cardiovascular:  Negative for chest pain and palpitations.  Neurological:  Negative for dizziness, tingling, tremors and headaches.  Psychiatric/Behavioral:  Positive for depression, hallucinations, substance abuse and suicidal ideas. Negative for memory loss. The patient is nervous/anxious and has insomnia.   All other systems reviewed and are negative.  negative for tremors, headache, cough, chest pain, nausea, vomiting, diarrhea, constipation. Blood pressure 138/87, pulse 77, temperature 98.5 F (36.9 C), temperature source Oral, resp. rate 16, height 5\' 8"  (1.727 m), weight 107.2 kg, SpO2 99 %. Body mass index is 35.94 kg/m.   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  Substance-induced psychosis  -- Likely related to cannabis use  -- Symptoms include hallucinations, paranoid delusions, disorganized thoughts, negative symptoms  -- Start Risperdal 1 mg once daily at night for psychosi  3.  Major depressive disorder  -- Patient endorses decreased mood, loss of interest in activities, sleep disturbances, worthlessness, decreased energy, decreased concentration, passive suicidal ideation  -- Continue sertraline 50 mg once daily started by admitting provider (not home med)   -- Establish outpatient therapy upon discharge       5. Labs Reviewed: UDS, CMP, ethanol level, salicyclate, acetaminophen level, CBC  -- all within normal limits. UDS positive THC     Lab ordered: lipid, hba1c    6. Group and Therapy: -- Encouraged patient to participate in unit milieu and in scheduled group therapies   --Substance Use counseling: Consider outpatient  7. Discharge Planning:   -- Social work and case management to assist with discharge planning and identification of hospital follow-up needs prior to discharge  -- Estimated LOS: 3-5 days  -- Discharge Concerns: Need to establish a safety plan; Medication compliance and effectiveness  -- Discharge Goals: Return home with outpatient referrals for mental health follow-up including medication management/psychotherapy    Physician Treatment Plan for Primary Diagnosis: MDD (major depressive disorder), recurrent severe,  without psychosis (HCC) Long Term Goal(s): Improvement in symptoms so as ready for discharge  Short Term Goals: Ability to verbalize feelings will improve, Ability to disclose and discuss suicidal ideas, Ability to identify and develop effective coping behaviors will improve, Ability to maintain clinical measurements within normal limits will improve, and Compliance with prescribed medications will improve   I certify that inpatient services furnished can reasonably be expected to improve the patient's condition.     Cristy Hilts, MD 7/2/20244:38 PM  Total Time Spent in Direct Patient Care:  I personally spent 60 minutes on the unit in direct patient care. The direct patient care time included face-to-face time with the patient, reviewing the patient's chart, communicating with other professionals, and coordinating care. Greater than 50% of this time was spent in counseling or coordinating care with the patient regarding goals of hospitalization, psycho-education, and discharge planning needs.  I have independently evaluated the patient during a face-to-face assessment. I reviewed the patient's chart, and I participated in key portions of the service. I discussed the case with the [employer], and I agree with the assessment and plan of care as documented in the House Officer's note, as addended by me or notated below:  I directly edited the note, as above.   Phineas Inches, MD Psychiatrist

## 2022-07-24 NOTE — Progress Notes (Signed)
Adult Psychoeducational Group Note  Date:  07/24/2022 Time:  1:21 PM  Group Topic/Focus:  Goals Group:   The focus of this group is to help patients establish daily goals to achieve during treatment and discuss how the patient can incorporate goal setting into their daily lives to aide in recovery. Orientation:   The focus of this group is to educate the patient on the purpose and policies of crisis stabilization and provide a format to answer questions about their admission.  The group details unit policies and expectations of patients while admitted.  Participation Level:  Did Not Attend  Participation Quality:    Affect:    Cognitive:    Insight:   Engagement in Group:    Modes of Intervention:    Additional Comments: Pt did not attend group  [employer] 07/24/2022, 1:21 PM

## 2022-07-24 NOTE — Group Note (Signed)
Date:  07/24/2022 Time:  8:52 PM  Group Topic/Focus:  Wrap-Up Group:   The focus of this group is to help patients review their daily goal of treatment and discuss progress on daily workbooks.    Participation Level:  Did Not Attend  Participation Quality:   n/a  Affect:   did not attend  Cognitive:   n/a  Insight: None  Engagement in Group:   n/a  Modes of Intervention:   n.a  Additional Comments:  did not attend group  Sherrel Ploch E Deshana Rominger 07/24/2022, 8:52 PM
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
## 2022-07-24 NOTE — Group Note (Signed)
Date:  07/24/2022 Time:  12:10 PM  Group Topic/Focus:  Managing Feelings:   The focus of this group is to identify what feelings patients have difficulty handling and develop a plan to handle them in a healthier way upon discharge.    Participation Level:  Did Not Attend  Participation Quality:    Affect:    Cognitive:    Insight:   Engagement in Group:    Modes of Intervention:    Additional Comments:    [name] Justin Anderson 07/24/2022, 12:10 PM

## 2022-07-24 NOTE — Group Note (Signed)
Recreation Therapy Group Note   Group Topic:Animal Assisted Therapy   Group Date: 07/24/2022 Start Time: 0950 End Time: 1030 Facilitators: Channelle Bottger-McCall, LRT,CTRS Location: [address] Dayroom   Animal-Assisted Activity (AAA) Program Checklist/Progress Notes Patient Eligibility Criteria Checklist & Daily Group note for Rec Tx Intervention  AAA/T Program Assumption of Risk Form signed by Patient/ or Parent Legal Guardian Yes  Patient understands his/her participation is voluntary Yes   Affect/Mood: N/A   Participation Level: Did not attend    Clinical Observations/Individualized Feedback:    Plan: Continue to engage patient in RT group sessions 2-3x/week.   Aleja Yearwood-McCall, LRT,CTRS 07/24/2022 12:56 PM

## 2022-07-24 NOTE — Progress Notes (Signed)
Patient denies SI, HI and AVH. Says the last time he experienced AH was 3 days ago and it started a week ago. Patient rates depression 8/10 and anxiety 0/10. Patient is very animated, laughing and smiling, which is incongruent with the 8/10 depression rating. Patient's blood pressure was elevated at 152/99 clonidine administered at 0920. Patient remains in room this morning. Safety maintained on the unit Q 15 minute safety checks ongoing.   07/24/22 0900  Psych Admission Type (Psych Patients Only)  Admission Status Voluntary  Psychosocial Assessment  Patient Complaints Depression  Eye Contact Fair  Facial Expression Animated  Affect Inconsistent with thought content  Speech Logical/coherent  Interaction Assertive  Motor Activity Fidgety  Appearance/Hygiene Unremarkable  Behavior Characteristics Cooperative  Mood Pleasant  Thought Process  Coherency WDL  Content WDL  Delusions None reported or observed  Perception WDL  Hallucination None reported or observed  Judgment Poor  Confusion None  Danger to Self  Current suicidal ideation? Denies  Danger to Others  Danger to Others None reported or observed

## 2022-07-25 ENCOUNTER — Encounter (HOSPITAL_COMMUNITY): Payer: Self-pay

## 2022-07-25 DIAGNOSIS — F322 Major depressive disorder, single episode, severe without psychotic features: Secondary | ICD-10-CM

## 2022-07-25 LAB — HEMOGLOBIN A1C
Hgb A1c MFr Bld: 5.2 % (ref 4.8–5.6)
Mean Plasma Glucose: 102.54 mg/dL

## 2022-07-25 LAB — LIPID PANEL
Cholesterol: 74 mg/dL (ref 0–200)
HDL: 25 mg/dL — ABNORMAL LOW (ref >40)
LDL Cholesterol: 31 mg/dL (ref 0–99)
Total CHOL/HDL Ratio: 3 RATIO
Triglycerides: 89 mg/dL (ref <150)
VLDL: 18 mg/dL (ref 0–40)

## 2022-07-25 MED ORDER — LORATADINE 10 MG PO TABS
10.0000 mg | ORAL_TABLET | Freq: Every day | ORAL | Status: DC
  Administered 2022-07-25 – 2022-07-28 (×4): 10 mg via ORAL
  Filled 2022-07-25 (×8): qty 1

## 2022-07-25 MED ORDER — RISPERIDONE 2 MG PO TABS
2.0000 mg | ORAL_TABLET | Freq: Every day | ORAL | Status: DC
  Administered 2022-07-25 – 2022-07-27 (×3): 2 mg via ORAL
  Filled 2022-07-25 (×6): qty 1

## 2022-07-25 NOTE — Progress Notes (Signed)
   07/25/22 1200  Psych Admission Type (Psych Patients Only)  Admission Status Voluntary  Psychosocial Assessment  Patient Complaints Depression  Eye Contact Fair  Facial Expression Animated  Affect Appropriate to circumstance  Speech Logical/coherent  Interaction Assertive  Motor Activity Slow  Appearance/Hygiene Unremarkable  Behavior Characteristics Cooperative  Mood Pleasant  Thought Process  Coherency WDL  Content WDL  Delusions None reported or observed  Perception WDL  Hallucination None reported or observed  Judgment Poor  Confusion None  Danger to Self  Current suicidal ideation? Denies  Danger to Others  Danger to Others None reported or observed

## 2022-07-25 NOTE — Group Note (Signed)
Date:  07/25/2022 Time:  3:20 PM  Group Topic/Focus:  Orientation:   The focus of this group is to educate the patient on the purpose and policies of crisis stabilization and provide a format to answer questions about their admission.  The group details unit policies and expectations of patients while admitted.    Participation Level:  Did Not Attend  Participation Quality:      Affect:      Cognitive:      Insight: None   Modes of Intervention:      Additional Comments:     Reymundo Poll 07/25/2022, 3:20 PM

## 2022-07-25 NOTE — Progress Notes (Signed)
   07/24/22 2040  Psych Admission Type (Psych Patients Only)  Admission Status Voluntary  Psychosocial Assessment  Patient Complaints Depression  Eye Contact Fair  Facial Expression Animated  Affect Appropriate to circumstance  Speech Logical/coherent  Interaction Assertive  Motor Activity Slow  Appearance/Hygiene Unremarkable  Behavior Characteristics Cooperative  Mood Pleasant  Thought Process  Coherency WDL  Content WDL  Delusions None reported or observed  Perception WDL  Hallucination None reported or observed  Judgment Poor  Confusion None  Danger to Self  Current suicidal ideation? Denies  Danger to Others  Danger to Others None reported or observed

## 2022-07-25 NOTE — Group Note (Signed)
Recreation Therapy Group Note   Group Topic:Problem Solving  Group Date: 07/25/2022 Start Time: 1005 End Time: 1035 Facilitators: Manvir Thorson-McCall, LRT,CTRS Location: [address] Dayroom   Goal Area(s) Addresses:  Patient will effectively work in a team with other group members. Patient will verbalize importance of using appropriate problem solving techniques.  Patient will identify positive change associated with effective problem solving skills.   Group Description: Brain Teasers.  Patients were given two sheets of brain teasers. Patients were to complete the teasers to the best of their ability. Once completed, LRT and patients reviewed the answers. Patients would add all the questions they got correct, the person with the most correct answers got a prize. If there was a tie, LRT would ask a tie breaker question.    Affect/Mood: N/A   Participation Level: Did not attend    Clinical Observations/Individualized Feedback:     Plan: Continue to engage patient in RT group sessions 2-3x/week.   Retia Cordle-McCall, LRT,CTRS 07/25/2022 1:28 PM

## 2022-07-25 NOTE — [facility] Group Notes (Signed)
Spiritual care group facilitated by Chaplain Katy Taeja Debellis, [facility]  Group focused on topic of strength. Group members reflected on what thoughts and feelings emerge when they hear this topic. They then engaged in facilitated dialog around how strength is present in their lives. This dialog focused on representing what strength had been to them in their lives (images and patterns given) and what they saw as helpful in their life now (what they needed / wanted).  Activity drew on narrative framework.  Patient Progress: Did not attend.  

## 2022-07-25 NOTE — [facility] Group Notes (Signed)
[facility] Group Notes:  (Nursing/MHT/Case Management/Adjunct)  Date:  07/25/2022  Time:  8:04 PM  Type of Therapy:   NA group  Participation Level:  Did Not Attend  Participation Quality:    Affect:    Cognitive:    Insight:    Engagement in Group:    Modes of Intervention:    Summary of Progress/Problems: didn't attend.  Justin Anderson 07/25/2022, 8:04 PM

## 2022-07-25 NOTE — Progress Notes (Addendum)
[facility] MD Progress Note  07/25/2022 1:49 PM Justin Anderson  MRN:  [id number]  Subjective:   Justin Anderson is a 22 y.o., male who denies having any formally diagnosed past psychiatric history, who was admitted to the [facility] from [facility] ED, for evaluation and management of suicidal ideation, homicidal thoughts, auditory hallucinations, paranoia, and agitation. He was transferred voluntarily from the ED to the [facility].    Case was discussed in the multidisciplinary team. MAR was reviewed and patient is compliant with medications.   Psychiatric Team made the following recommendations yesterday: Start risperdal 1mg  once at bedtime  Continue sertraline 50mg  once at bedtime  On interview today patient reports he slept good last night -- sleeping more than usual. He reports his appetite is good.  He has mild headaches. He reports no SI or HI. He is still experiencing auditory hallucinations and mild paranoia.  He reports no Ideas of Reference or other First Rank symptoms.  He reports reports no issues with medications but does noticed some mild sedation. He reports no symptoms of alcohol withdrawal including palpitations, sweating, insomnia, tremors, nausea, vomiting.  He expressed that he wants a medication that he only is take 1 time a day.  He also reported symptoms of allergies including cough and congestion and runny nose.  No fever, shortness of breath, sore throat.   Principal Problem: MDD (major depressive disorder), recurrent severe, without psychosis (HCC) Diagnosis: Principal Problem:   MDD (major depressive disorder), recurrent severe, without psychosis (HCC) Active Problems:   Psychoactive substance-induced psychosis (HCC)   Cannabis abuse   Alcohol abuse   Total Time spent with patient: 15 minutes  Past Psychiatric History: no formally diagnosed past psychiatric history  Past Medical History:  Past Medical History:  Diagnosis Date   Environmental allergies     History reviewed. No pertinent surgical history. Family History:  Family History  Problem Relation Age of Onset   Hypertension Mother    Diabetes type II Father    Family Psychiatric  History: none Social History:  Social History   Substance and Sexual Activity  Alcohol Use Yes   Alcohol/week: 4.0 standard drinks of alcohol   Types: 4 Shots of liquor per week     Social History   Substance and Sexual Activity  Drug Use Yes   Types: Marijuana    Social History   Socioeconomic History   Marital status: Single    Spouse name: Not on file   Number of children: Not on file   Years of education: Not on file   Highest education level: Not on file  Occupational History   Not on file  Tobacco Use   Smoking status: Every Day    Types: Cigars   Smokeless tobacco: Never  Vaping Use   Vaping Use: Never used  Substance and Sexual Activity   Alcohol use: Yes    Alcohol/week: 4.0 standard drinks of alcohol    Types: 4 Shots of liquor per week   Drug use: Yes    Types: Marijuana   Sexual activity: Not Currently  Other Topics Concern   Not on file  Social History Narrative   Not on file   Social Determinants of Health   Financial Resource Strain: Not on file  Food Insecurity: No Food Insecurity (07/23/2022)   Hunger Vital Sign    Worried About Running Out of Food in the Last Year: Never true    Ran Out of Food in the Last Year: Never true  Transportation Needs: No Transportation Needs (07/23/2022)   PRAPARE - Administrator, Civil Service (Medical): No    Lack of Transportation (Non-Medical): No  Physical Activity: Not on file  Stress: Not on file  Social Connections: Not on file   Additional Social History: see H&P      Sleep: Good  Appetite:  Good  Current Medications: Current Facility-Administered Medications  Medication Dose Route Frequency Provider Last Rate Last Admin   acetaminophen (TYLENOL) tablet 650 mg  650 mg Oral Q6H PRN Eligha Bridegroom,  NP       alum & mag hydroxide-simeth (MAALOX/MYLANTA) 200-200-20 MG/5ML suspension 30 mL  30 mL Oral Q4H PRN Eligha Bridegroom, NP       cloNIDine (CATAPRES) tablet 0.1 mg  0.1 mg Oral Q4H PRN Massengill, Harrold Donath, MD   0.1 mg at 07/24/22 9562   haloperidol (HALDOL) tablet 5 mg  5 mg Oral TID PRN Phineas Inches, MD       And   LORazepam (ATIVAN) tablet 2 mg  2 mg Oral TID PRN Phineas Inches, MD       And   diphenhydrAMINE (BENADRYL) capsule 50 mg  50 mg Oral TID PRN Massengill, Harrold Donath, MD       haloperidol lactate (HALDOL) injection 5 mg  5 mg Intramuscular TID PRN Massengill, Harrold Donath, MD       And   LORazepam (ATIVAN) injection 2 mg  2 mg Intramuscular TID PRN Massengill, Harrold Donath, MD       And   diphenhydrAMINE (BENADRYL) injection 50 mg  50 mg Intramuscular TID PRN Massengill, Harrold Donath, MD       hydrOXYzine (ATARAX) tablet 25 mg  25 mg Oral Q6H PRN Meryl Dare, MD   25 mg at 07/24/22 2055   loperamide (IMODIUM) capsule 2-4 mg  2-4 mg Oral PRN Meryl Dare, MD   2 mg at 07/24/22 2055   loratadine (CLARITIN) tablet 10 mg  10 mg Oral Daily Meryl Dare, MD       LORazepam (ATIVAN) tablet 1 mg  1 mg Oral Q6H PRN Meryl Dare, MD       magnesium hydroxide (MILK OF MAGNESIA) suspension 30 mL  30 mL Oral Daily PRN Eligha Bridegroom, NP       multivitamin with minerals tablet 1 tablet  1 tablet Oral Daily Meryl Dare, MD   1 tablet at 07/25/22 1308   nicotine polacrilex (NICORETTE) gum 2 mg  2 mg Oral PRN Bobbitt, Shalon E, NP       ondansetron (ZOFRAN-ODT) disintegrating tablet 4 mg  4 mg Oral Q6H PRN Meryl Dare, MD       risperiDONE (RISPERDAL) tablet 2 mg  2 mg Oral QHS Meryl Dare, MD       sertraline (ZOLOFT) tablet 50 mg  50 mg Oral Daily Eligha Bridegroom, NP   50 mg at 07/25/22 6578   thiamine (Vitamin B-1) tablet 100 mg  100 mg Oral Daily Meryl Dare, MD   100 mg at 07/25/22 0829   traZODone (DESYREL) tablet 50 mg  50 mg Oral QHS PRN Eligha Bridegroom, NP   50 mg at  07/24/22 2055    Lab Results:  Results for orders placed or performed during the hospital encounter of 07/23/22 (from the past 48 hour(s))  Hemoglobin A1c     Status: None   Collection Time: 07/25/22  6:38 AM  Result Value Ref Range   Hgb A1c MFr Bld 5.2 4.8 - 5.6 %    Comment: (  NOTE) Pre diabetes:          5.7%-6.4%  Diabetes:              >6.4%  Glycemic control for   <7.0% adults with diabetes    Mean Plasma Glucose 102.54 mg/dL    Comment: Performed at [facility], [address] [place], [place] [address]  Lipid panel     Status: Abnormal   Collection Time: 07/25/22  6:38 AM  Result Value Ref Range   Cholesterol 74 0 - 200 mg/dL   Triglycerides 89 <604 mg/dL   HDL 25 (L) >54 mg/dL   Total CHOL/HDL Ratio 3.0 RATIO   VLDL 18 0 - 40 mg/dL   LDL Cholesterol 31 0 - 99 mg/dL    Comment:        Total Cholesterol/HDL:CHD Risk Coronary Heart Disease Risk Table                     Men   Women  1/2 Average Risk   3.4   3.3  Average Risk       5.0   4.4  2 X Average Risk   9.6   7.1  3 X Average Risk  23.4   11.0        Use the calculated Patient Ratio above and the CHD Risk Table to determine the patient's CHD Risk.        ATP III CLASSIFICATION (LDL):  <100     mg/dL   Optimal  098-119  mg/dL   Near or Above                    Optimal  130-159  mg/dL   Borderline  147-829  mg/dL   High  >562     mg/dL   Very High Performed at [facility], [address] [place], [place] [address]     Blood Alcohol level:  Lab Results  Component Value Date   ETH <10 07/22/2022    Metabolic Disorder Labs: Lab Results  Component Value Date   HGBA1C 5.2 07/25/2022   MPG 102.54 07/25/2022     Physical Findings: AIMS:  , ,  ,  ,    CIWA:  CIWA-Ar Total: 0 COWS:     Musculoskeletal: Strength & Muscle Tone: within normal limits Gait & Station: normal Patient leans: N/A  Psychiatric Specialty Exam:  Presentation  General Appearance:   Appropriate for Environment  Eye Contact: Good  Speech: Normal Rate  Speech Volume: Normal  Handedness: Right   Mood and Affect  Mood: Euthymic (incongruent with depressive symptoms at presentation)  Affect: Non-Congruent   Thought Process  Thought Processes: Disorganized  Descriptions of Associations:Circumstantial  Orientation:Full (Time, Place and Person) (Not formally tested)  Thought Content:Paranoid Ideation; Illogical  History of Schizophrenia/Schizoaffective disorder:No data recorded Duration of Psychotic Symptoms:No data recorded Hallucinations:Hallucinations: Auditory; Other (comment) Description of Auditory Hallucinations: Twitching behind the eyes  Ideas of Reference:None  Suicidal Thoughts:SI Passive Intent and/or Plan: Without Plan; Without Intent  Homicidal Thoughts:Homicidal Thoughts: No   Sensorium  Memory: Immediate Fair; Recent Fair  Judgment: Fair  Insight: Fair   Executive Functions  Concentration: Good  Attention Span: Good  Recall: Good  Fund of Knowledge: Good  Language: Good   Psychomotor Activity  Psychomotor Activity:No data recorded  Assets  Assets: Desire for Improvement; Physical Health; Resilience; Social Support   Sleep  Sleep:No data recorded   Physical Exam: Physical  Exam Vitals and nursing note reviewed.  Constitutional:      Appearance: He is not ill-appearing.    Review of Systems  Constitutional:  Negative for chills and fever.  Cardiovascular:  Negative for chest pain and palpitations.  Gastrointestinal:  Negative for constipation, diarrhea, nausea and vomiting.  Neurological:  Positive for headaches. Negative for dizziness, tremors and weakness.  Psychiatric/Behavioral:  Positive for depression and hallucinations. Negative for suicidal ideas.    Blood pressure (!) 148/76, pulse 67, temperature 98.5 F (36.9 C), temperature source Oral, resp. rate 16, height 5\' 8"  (1.727 m),  weight 107.2 kg, SpO2 100 %. Body mass index is 35.94 kg/m.   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  While he reports mild sedation, he is still experiencing auditory hallucinations, so he would likely benefit from a dose increase.  Hemoglobin A1c normal at 5.2.  Lipids within normal ranges except HDL but not concerned about this.  Baseline labs for following on long-term Risperdal use.  He was experiencing some upper respiratory symptoms, consistent with his typical allergies that he normally takes cetirizine for.     PLAN: Safety and Monitoring:             -- Involuntary admission to inpatient psychiatric unit for safety, stabilization and treatment             -- Daily contact with patient to assess and evaluate symptoms and progress in treatment             -- Patient's case to be discussed in multi-disciplinary team meeting             -- Observation Level : q15 minute checks             -- Vital signs:  q12 hours             -- Precautions: suicide, elopement, and assault   2. Psychiatric Diagnoses and Treatment:  -- Increase Risperdal 2 mg once at night for psychosis.  Reassess symptoms and side effects tomorrow.   -- Continue sertraline 50 mg once at night --  The risks/benefits/side-effects/alternatives to this medication were discussed in detail with the patient and time was given for questions. The patient consents to medication trial.              -- Metabolic profile and EKG monitoring obtained while on an atypical antipsychotic (BMI: 35, lipid Panel: Within normal limits except HDL, HbgA1c: 5.2, QTc: 376)              -- Encouraged patient to  participate in unit milieu and in scheduled group therapies              -- Short Term Goals: Ability to identify changes in lifestyle to reduce recurrence of condition will improve, Ability to verbalize feelings will improve, Ability to disclose and discuss suicidal ideas, Ability to demonstrate self-control will improve, Ability to identify and develop effective coping behaviors will improve, Ability to maintain clinical measurements within normal limits will improve, Compliance with prescribed medications will improve, and Ability to identify triggers associated with substance abuse/mental health issues will improve             -- Long Term  Goals: Improvement in symptoms so as ready for discharge            3. Medical Issues Being Addressed:              -- Started loratadine 10 mg once daily for allergies.  He normally takes cetirizine but it is not on formulary.    4. Discharge Planning:              -- Social work and case management to assist with discharge planning and identification of hospital follow-up needs prior to discharge             -- Estimated LOS: 5-7 days              -- Discharge Concerns: Need to establish a safety plan; Medication compliance and effectiveness             -- Discharge Goals: Return home with outpatient referrals for mental health follow-up including medication management/psychotherapy   I certify that inpatient services furnished can reasonably be expected to improve the patient's condition.     Meryl Dare, MD 07/25/2022, 1:49 PM

## 2022-07-25 NOTE — Group Note (Signed)
Date:  07/25/2022 Time:  5:03 PM  Group Topic/Focus:  Wellness Toolbox:   The focus of this group is to discuss various aspects of wellness, balancing those aspects and exploring ways to increase the ability to experience wellness.  Patients will create a wellness toolbox for use upon discharge.    Participation Level:  Did Not Attend  Participation Quality:      Affect:      Cognitive:      Insight: None  Engagement in Group:      Modes of Intervention:      Additional Comments:     Reymundo Poll 07/25/2022, 5:03 PM

## 2022-07-25 NOTE — BH IP Treatment Plan (Signed)
Interdisciplinary Treatment and Diagnostic Plan Update  07/25/2022 Time of Session: 10:30am Justin Anderson MRN: [id number]  Principal Diagnosis: MDD (major depressive disorder), recurrent severe, without psychosis (HCC)  Secondary Diagnoses: Principal Problem:   MDD (major depressive disorder), recurrent severe, without psychosis (HCC) Active Problems:   Psychoactive substance-induced psychosis (HCC)   Cannabis abuse   Alcohol abuse   Current Medications:  Current Facility-Administered Medications  Medication Dose Route Frequency Provider Last Rate Last Admin   acetaminophen (TYLENOL) tablet 650 mg  650 mg Oral Q6H PRN Eligha Bridegroom, NP       alum & mag hydroxide-simeth (MAALOX/MYLANTA) 200-200-20 MG/5ML suspension 30 mL  30 mL Oral Q4H PRN Eligha Bridegroom, NP       cloNIDine (CATAPRES) tablet 0.1 mg  0.1 mg Oral Q4H PRN Massengill, Harrold Donath, MD   0.1 mg at 07/24/22 6387   haloperidol (HALDOL) tablet 5 mg  5 mg Oral TID PRN Phineas Inches, MD       And   LORazepam (ATIVAN) tablet 2 mg  2 mg Oral TID PRN Phineas Inches, MD       And   diphenhydrAMINE (BENADRYL) capsule 50 mg  50 mg Oral TID PRN Massengill, Harrold Donath, MD       haloperidol lactate (HALDOL) injection 5 mg  5 mg Intramuscular TID PRN Massengill, Harrold Donath, MD       And   LORazepam (ATIVAN) injection 2 mg  2 mg Intramuscular TID PRN Massengill, Harrold Donath, MD       And   diphenhydrAMINE (BENADRYL) injection 50 mg  50 mg Intramuscular TID PRN Massengill, Harrold Donath, MD       hydrOXYzine (ATARAX) tablet 25 mg  25 mg Oral Q6H PRN Meryl Dare, MD   25 mg at 07/24/22 2055   loperamide (IMODIUM) capsule 2-4 mg  2-4 mg Oral PRN Meryl Dare, MD   2 mg at 07/24/22 2055   loratadine (CLARITIN) tablet 10 mg  10 mg Oral Daily Meryl Dare, MD   10 mg at 07/25/22 1408   LORazepam (ATIVAN) tablet 1 mg  1 mg Oral Q6H PRN Meryl Dare, MD       magnesium hydroxide (MILK OF MAGNESIA) suspension 30 mL  30 mL Oral Daily PRN Eligha Bridegroom, NP       multivitamin with minerals tablet 1 tablet  1 tablet Oral Daily Meryl Dare, MD   1 tablet at 07/25/22 5643   nicotine polacrilex (NICORETTE) gum 2 mg  2 mg Oral PRN Bobbitt, Shalon E, NP       ondansetron (ZOFRAN-ODT) disintegrating tablet 4 mg  4 mg Oral Q6H PRN Meryl Dare, MD       risperiDONE (RISPERDAL) tablet 2 mg  2 mg Oral QHS Meryl Dare, MD       sertraline (ZOLOFT) tablet 50 mg  50 mg Oral Daily Eligha Bridegroom, NP   50 mg at 07/25/22 3295   thiamine (Vitamin B-1) tablet 100 mg  100 mg Oral Daily Meryl Dare, MD   100 mg at 07/25/22 0829   traZODone (DESYREL) tablet 50 mg  50 mg Oral QHS PRN Eligha Bridegroom, NP   50 mg at 07/24/22 2055   PTA Medications: Medications Prior to Admission  Medication Sig Dispense Refill Last Dose   Cetirizine HCl (ZYRTEC PO) Take by mouth. (Patient not taking: Reported on 07/23/2022)      clotrimazole (LOTRIMIN) 1 % cream Apply to affected area 2 times daily (Patient not taking: Reported on 07/23/2022) 15 g 0  ketoconazole (NIZORAL) 2 % shampoo Apply 1 Application topically 2 (two) times a week. Lather onto scalp and chest rash. Allow to sit on the skin for 5-10 minutes, then rinse off with water. Do this twice a week on Mondays and Thursdays for 4 weeks. 120 mL 0     Patient Stressors: Loss of parent   Marital or family conflict    Patient Strengths: Motivation for treatment/growth  Supportive family/friends   Treatment Modalities: Medication Management, Group therapy, Case management,  1 to 1 session with clinician, Psychoeducation, Recreational therapy.   Physician Treatment Plan for Primary Diagnosis: MDD (major depressive disorder), recurrent severe, without psychosis (HCC) Long Term Goal(s): Improvement in symptoms so as ready for discharge   Short Term Goals: Ability to verbalize feelings will improve Ability to disclose and discuss suicidal ideas Ability to identify and develop effective coping behaviors  will improve Ability to maintain clinical measurements within normal limits will improve Compliance with prescribed medications will improve  Medication Management: Evaluate patient's response, side effects, and tolerance of medication regimen.  Therapeutic Interventions: 1 to 1 sessions, Unit Group sessions and Medication administration.  Evaluation of Outcomes: Progressing  Physician Treatment Plan for Secondary Diagnosis: Principal Problem:   MDD (major depressive disorder), recurrent severe, without psychosis (HCC) Active Problems:   Psychoactive substance-induced psychosis (HCC)   Cannabis abuse   Alcohol abuse  Long Term Goal(s): Improvement in symptoms so as ready for discharge   Short Term Goals: Ability to verbalize feelings will improve Ability to disclose and discuss suicidal ideas Ability to identify and develop effective coping behaviors will improve Ability to maintain clinical measurements within normal limits will improve Compliance with prescribed medications will improve     Medication Management: Evaluate patient's response, side effects, and tolerance of medication regimen.  Therapeutic Interventions: 1 to 1 sessions, Unit Group sessions and Medication administration.  Evaluation of Outcomes: Progressing   RN Treatment Plan for Primary Diagnosis: MDD (major depressive disorder), recurrent severe, without psychosis (HCC) Long Term Goal(s): Knowledge of disease and therapeutic regimen to maintain health will improve  Short Term Goals: Ability to remain free from injury will improve, Ability to verbalize frustration and anger appropriately will improve, Ability to demonstrate self-control, Ability to participate in decision making will improve, Ability to verbalize feelings will improve, Ability to disclose and discuss suicidal ideas, Ability to identify and develop effective coping behaviors will improve, and Compliance with prescribed medications will  improve  Medication Management: RN will administer medications as ordered by provider, will assess and evaluate patient's response and provide education to patient for prescribed medication. RN will report any adverse and/or side effects to prescribing provider.  Therapeutic Interventions: 1 on 1 counseling sessions, Psychoeducation, Medication administration, Evaluate responses to treatment, Monitor vital signs and CBGs as ordered, Perform/monitor CIWA, COWS, AIMS and Fall Risk screenings as ordered, Perform wound care treatments as ordered.  Evaluation of Outcomes: Progressing   LCSW Treatment Plan for Primary Diagnosis: MDD (major depressive disorder), recurrent severe, without psychosis (HCC) Long Term Goal(s): Safe transition to appropriate next level of care at discharge, Engage patient in therapeutic group addressing interpersonal concerns.  Short Term Goals: Engage patient in aftercare planning with referrals and resources, Increase social support, Increase ability to appropriately verbalize feelings, Increase emotional regulation, Facilitate acceptance of mental health diagnosis and concerns, Facilitate patient progression through stages of change regarding substance use diagnoses and concerns, Identify triggers associated with mental health/substance abuse issues, and Increase skills for wellness and recovery  Therapeutic  Interventions: Assess for all discharge needs, 1 to 1 time with Child psychotherapist, Explore available resources and support systems, Assess for adequacy in community support network, Educate family and significant other(s) on suicide prevention, Complete Psychosocial Assessment, Interpersonal group therapy.  Evaluation of Outcomes: Progressing   Progress in Treatment: Attending groups: Yes. Participating in groups: Yes. Taking medication as prescribed: Yes. Toleration medication: Yes. Family/Significant other contact made: No, will contact:  CSW will assess and identify  social supports  Patient understands diagnosis: Yes. Discussing patient identified problems/goals with staff: Yes. Medical problems stabilized or resolved: Yes. Denies suicidal/homicidal ideation: Yes. Issues/concerns per patient self-inventory: No.   New problem(s) identified: No, Describe:  none reported   New Short Term/Long Term Goal(s): medication stabilization, elimination of SI thoughts, development of comprehensive mental wellness plan.    Patient Goals:  Pt states, "I want to feel better and quit smoking"  Discharge Plan or Barriers: Patient recently admitted. CSW will continue to follow and assess for appropriate referrals and possible discharge planning.    Reason for Continuation of Hospitalization: Anxiety Depression Medication stabilization Other; describe psychosis   Estimated Length of Stay: 3-5 days  Last 3 [place] Suicide Severity Risk Score: Flowsheet Row Ad[facility] (Current[facility]22[facility]2022 [facility] ED from 07/09/2022 in [facility])  C-SSRS RISK CATEGORY High Risk High Risk No Risk       Last PHQ 2/9 Scores:     No data to display          Scribe for Treatment Team: Beatris Si, LCSW 07/25/2022 2:53 PM

## 2022-07-25 NOTE — [facility] Suicide Risk Assessment (Signed)
[facility] INPATIENT:  Family/Significant Other Suicide Prevention Education  Suicide Prevention Education:  Education Completed; Johnny Bridge ( grandma ) [phone],  (name of family member/significant other) has been identified by the patient as the family member/significant other with whom the patient will be residing, and identified as the person(s) who will aid the patient in the event of a mental health crisis (suicidal ideations/suicide attempt).  With written consent from the patient, the family member/significant other has been provided the following suicide prevention education, prior to the and/or following the discharge of the patient.  Grandma shared that patient has not been himself and that he was going through a break up. Grandma said that he was cursing at the voices in his head and called police once they noticed that he was getting easily irritated, bussing the door, and pacing. Lewayne Bunting has no guns or weapons in home and will pick patient up at 11AM; if he is released Saturday.  The suicide prevention education provided includes the following: Suicide risk factors Suicide prevention and interventions National Suicide Hotline telephone number [facility] assessment telephone number [facility] Emergency Assistance 911 [facility] and/or Residential Mobile Crisis Unit telephone number  Request made of family/significant other to: Remove weapons (e.g., guns, rifles, knives), all items previously/currently identified as safety concern.   Remove drugs/medications (over-the-counter, prescriptions, illicit drugs), all items previously/currently identified as a safety concern.  The family member/significant other verbalizes understanding of the suicide prevention education information provided.  The family member/significant other agrees to remove the items of safety concern listed above.  Isabella Bowens 07/25/2022, 3:34 PM

## 2022-07-25 NOTE — Plan of Care (Signed)
  Problem: Education: Goal: Emotional status will improve Outcome: Progressing Goal: Mental status will improve Outcome: Progressing Goal: Verbalization of understanding the information provided will improve Outcome: Progressing   Problem: Activity: Goal: Interest or engagement in activities will improve Outcome: Progressing Goal: Sleeping patterns will improve Outcome: Progressing   Problem: Coping: Goal: Ability to verbalize frustrations and anger appropriately will improve Outcome: Progressing Goal: Ability to demonstrate self-control will improve Outcome: Progressing   Problem: Health Behavior/Discharge Planning: Goal: Identification of resources available to assist in meeting health care needs will improve Outcome: Progressing Goal: Compliance with treatment plan for underlying cause of condition will improve Outcome: Progressing   

## 2022-07-26 NOTE — Group Note (Signed)
Date:  07/26/2022 Time:  12:14 PM  Group Topic/Focus:     SOCIAL WORK GROUP                                                   [facility] LCSW Group Therapy Note    Group Date: @GROUPDATE @ Start Time: @GROUPSTARTTIME @ End Time: @GROUPENDTIME @  Type of Therapy and Topic:  Group Therapy:  Overcoming Obstacles  Participation Level:    Mood:  Description of Group:   In this group patients will be encouraged to explore what they see as obstacles to their own wellness and recovery. They will be guided to discuss their thoughts, feelings, and behaviors related to these obstacles. The group will process together ways to cope with barriers, with attention given to specific choices patients can make. Each patient will be challenged to identify changes they are motivated to make in order to overcome their obstacles. This group will be process-oriented, with patients participating in exploration of their own experiences as well as giving and receiving support and challenge from other group members.  Therapeutic Goals: 1. Patient will identify personal and current obstacles as they relate to admission. 2. Patient will identify barriers that currently interfere with their wellness or overcoming obstacles.  3. Patient will identify feelings, thought process and behaviors related to these barriers. 4. Patient will identify two changes they are willing to make to overcome these obstacles:    Summary of Patient Progress     Therapeutic Modalities:   Cognitive Behavioral Therapy Solution Focused Therapy Motivational Interviewing Relapse Prevention Therapy   Justin Anderson Justin Anderson   Participation Level:  Did Not Attend  Participation Quality:    Affect:    Cognitive:    Insight:   Engagement in Group:    Modes of Intervention:    Additional Comments:    Justin Anderson 07/26/2022, 12:14 PM

## 2022-07-26 NOTE — Progress Notes (Addendum)
   07/26/22 2034  Psych Admission Type (Psych Patients Only)  Admission Status Voluntary  Psychosocial Assessment  Patient Complaints None  Eye Contact Fair  Facial Expression Other (Comment) (smiling)  Affect Appropriate to circumstance  Speech Logical/coherent  Interaction Assertive  Motor Activity Other (Comment) (wdl)  Appearance/Hygiene Unremarkable  Behavior Characteristics Cooperative;Appropriate to situation  Mood Pleasant  Thought Process  Coherency WDL  Content WDL  Delusions None reported or observed  Perception WDL  Hallucination None reported or observed (denied at this time)  Judgment UTA  Confusion None  Danger to Self  Current suicidal ideation? Denies  Agreement Not to Harm Self Yes  Description of Agreement verbal  Danger to Others  Danger to Others None reported or observed   D: Pt alert and oriented. Pt rates depression 0/10 and anxiety 0/10.  Pt denies experiencing any SI/HI, or AVH at this time.   A: Scheduled medications administered to pt, per MD orders. Support and encouragement provided. Frequent verbal contact made. Routine safety checks conducted q15 minutes.   R: No adverse drug reactions noted. Pt verbally contracts for safety at this time. Pt complaint with medications and treatment plan. Pt interacts well with others on the unit. Pt remains safe at this time. Will continue to monitor.Patient uses the bathroom often and stays inside for long time before going to bed and observed the same behavior yesterday early morning but denies diarrhea or stomach ache.Blood pressure was 148/111. Administered Clonidine 0.1 mg at 0716.

## 2022-07-26 NOTE — Progress Notes (Signed)
   07/26/22 0100  Psych Admission Type (Psych Patients Only)  Admission Status Voluntary  Psychosocial Assessment  Patient Complaints None  Eye Contact Fair  Facial Expression Other (Comment) (smiling)  Affect Appropriate to circumstance  Speech Logical/coherent  Interaction Assertive  Motor Activity Other (Comment) (wdl)  Appearance/Hygiene Unremarkable  Behavior Characteristics Appropriate to situation  Mood Elated  Thought Process  Coherency WDL  Content WDL  Delusions None reported or observed  Perception WDL  Hallucination None reported or observed (denied at this time)  Judgment UTA  Confusion None  Danger to Self  Current suicidal ideation? Denies  Danger to Others  Danger to Others None reported or observed   D: Pt alert and oriented. Pt rates depression 0/10 and anxiety 0/10.  Pt denies experiencing any SI/HI, or AVH at this time.   A: Scheduled medications administered to pt, per MD orders. Support and encouragement provided. Frequent verbal contact made. Routine safety checks conducted q15 minutes.   R: No adverse drug reactions noted. Pt verbally contracts for safety at this time. Pt complaint with medications and treatment plan. Pt isolates in the room. Pt remains safe at this time. Will continue to monitor. Patient's blood pressure was high and administered PRN Clonidine 0.1 mg at 0657.

## 2022-07-26 NOTE — Progress Notes (Signed)
   07/26/22 1100  Psych Admission Type (Psych Patients Only)  Admission Status Voluntary  Psychosocial Assessment  Patient Complaints None  Eye Contact Fair  Facial Expression Animated  Affect Appropriate to circumstance  Speech Logical/coherent  Interaction Assertive  Motor Activity Other (Comment) (WNL)  Appearance/Hygiene Unremarkable  Behavior Characteristics Cooperative  Mood Pleasant  Thought Process  Coherency WDL  Content WDL  Delusions None reported or observed  Perception WDL  Hallucination None reported or observed  Judgment Poor  Confusion None  Danger to Self  Current suicidal ideation? Denies  Agreement Not to Harm Self Yes  Description of Agreement verbal  Danger to Others  Danger to Others None reported or observed

## 2022-07-26 NOTE — [facility] Group Notes (Signed)
[facility] Group Notes:  (Nursing/MHT/Case Management/Adjunct)  Date:  07/26/2022  Time:  9:26 PM  Type of Therapy:   Wrap-up group  Participation Level:  Active  Participation Quality:  Appropriate  Affect:  Appropriate  Cognitive:  Appropriate  Insight:  Appropriate  Engagement in Group:  Engaged  Modes of Intervention:  Education  Summary of Progress/Problems: Pt goal to be happy. Day 5/10.   Noah Delaine 07/26/2022, 9:26 PM

## 2022-07-26 NOTE — Progress Notes (Signed)
[facility] MD Progress Note  07/26/2022 10:02 AM Justin Anderson  MRN:  [id number]  Subjective:   Justin Anderson is a 22 y.o., male who denies having any formally diagnosed past psychiatric history, who was admitted to the [facility] from [facility] ED, for evaluation and management of suicidal ideation, homicidal thoughts, auditory hallucinations, paranoia, and agitation. He was transferred voluntarily from the ED to the [facility].    Case was discussed in the multidisciplinary team. MAR was reviewed and patient is compliant with medications.   Psychiatric Team made the following recommendations yesterday: Increase risperdal 1--> 2mg  once at bedtime  Continue sertraline 50mg  once at bedtime   Today 7/4, [name] reports no concerns.  He says that he is no longer hearing voices.  However he does note some headache, sedation, mild vertigo, and weakness.  He has spent a lot of time in bed but he reports only getting 2 to 4 hours of quality sleep.  He is eating and drinking okay.  He had a bowel movement yesterday.  He also reported improvement of his congestion, cough, and runny nose after receiving the allergy medication.  He reports no SI, HI, AVH.  He reports no symptoms of alcohol withdrawal including palpitations, sweating, insomnia, tremors, nausea, and vomiting.   Principal Problem: MDD (major depressive disorder), recurrent severe, without psychosis (HCC) Diagnosis: Principal Problem:   MDD (major depressive disorder), recurrent severe, without psychosis (HCC) Active Problems:   Psychoactive substance-induced psychosis (HCC)   Cannabis abuse   Alcohol abuse   Total Time spent with patient: 15 minutes  Past Psychiatric History: no formally diagnosed past psychiatric history  Past Medical History:  Past Medical History:  Diagnosis Date   Environmental allergies    History reviewed. No pertinent surgical history. Family History:  Family History  Problem Relation Age of Onset    Hypertension Mother    Diabetes type II Father    Family Psychiatric  History: none Social History:  Social History   Substance and Sexual Activity  Alcohol Use Yes   Alcohol/week: 4.0 standard drinks of alcohol   Types: 4 Shots of liquor per week     Social History   Substance and Sexual Activity  Drug Use Yes   Types: Marijuana    Social History   Socioeconomic History   Marital status: Single    Spouse name: Not on file   Number of children: Not on file   Years of education: Not on file   Highest education level: Not on file  Occupational History   Not on file  Tobacco Use   Smoking status: Every Day    Types: Cigars   Smokeless tobacco: Never  Vaping Use   Vaping Use: Never used  Substance and Sexual Activity   Alcohol use: Yes    Alcohol/week: 4.0 standard drinks of alcohol    Types: 4 Shots of liquor per week   Drug use: Yes    Types: Marijuana   Sexual activity: Not Currently  Other Topics Concern   Not on file  Social History Narrative   Not on file   Social Determinants of Health   Financial Resource Strain: Not on file  Food Insecurity: No Food Insecurity (07/23/2022)   Hunger Vital Sign    Worried About Running Out of Food in the Last Year: Never true    Ran Out of Food in the Last Year: Never true  Transportation Needs: No Transportation Needs (07/23/2022)   PRAPARE - Transportation  Lack of Transportation (Medical): No    Lack of Transportation (Non-Medical): No  Physical Activity: Not on file  Stress: Not on file  Social Connections: Not on file   Additional Social History: see H&P      Sleep: Good  Appetite:  Good  Current Medications: Current Facility-Administered Medications  Medication Dose Route Frequency Provider Last Rate Last Admin   acetaminophen (TYLENOL) tablet 650 mg  650 mg Oral Q6H PRN Eligha Bridegroom, NP       alum & mag hydroxide-simeth (MAALOX/MYLANTA) 200-200-20 MG/5ML suspension 30 mL  30 mL Oral Q4H PRN  Eligha Bridegroom, NP       cloNIDine (CATAPRES) tablet 0.1 mg  0.1 mg Oral Q4H PRN Massengill, Harrold Donath, MD   0.1 mg at 07/26/22 0981   haloperidol (HALDOL) tablet 5 mg  5 mg Oral TID PRN Phineas Inches, MD       And   LORazepam (ATIVAN) tablet 2 mg  2 mg Oral TID PRN Phineas Inches, MD       And   diphenhydrAMINE (BENADRYL) capsule 50 mg  50 mg Oral TID PRN Massengill, Harrold Donath, MD       haloperidol lactate (HALDOL) injection 5 mg  5 mg Intramuscular TID PRN Massengill, Harrold Donath, MD       And   LORazepam (ATIVAN) injection 2 mg  2 mg Intramuscular TID PRN Massengill, Harrold Donath, MD       And   diphenhydrAMINE (BENADRYL) injection 50 mg  50 mg Intramuscular TID PRN Massengill, Harrold Donath, MD       hydrOXYzine (ATARAX) tablet 25 mg  25 mg Oral Q6H PRN Meryl Dare, MD   25 mg at 07/24/22 2055   loperamide (IMODIUM) capsule 2-4 mg  2-4 mg Oral PRN Meryl Dare, MD   2 mg at 07/24/22 2055   loratadine (CLARITIN) tablet 10 mg  10 mg Oral Daily Meryl Dare, MD   10 mg at 07/26/22 0814   LORazepam (ATIVAN) tablet 1 mg  1 mg Oral Q6H PRN Meryl Dare, MD       magnesium hydroxide (MILK OF MAGNESIA) suspension 30 mL  30 mL Oral Daily PRN Eligha Bridegroom, NP       multivitamin with minerals tablet 1 tablet  1 tablet Oral Daily Meryl Dare, MD   1 tablet at 07/26/22 1914   nicotine polacrilex (NICORETTE) gum 2 mg  2 mg Oral PRN Bobbitt, Shalon E, NP       ondansetron (ZOFRAN-ODT) disintegrating tablet 4 mg  4 mg Oral Q6H PRN Meryl Dare, MD       risperiDONE (RISPERDAL) tablet 2 mg  2 mg Oral QHS Meryl Dare, MD   2 mg at 07/25/22 2056   sertraline (ZOLOFT) tablet 50 mg  50 mg Oral Daily Eligha Bridegroom, NP   50 mg at 07/26/22 7829   thiamine (Vitamin B-1) tablet 100 mg  100 mg Oral Daily Meryl Dare, MD   100 mg at 07/26/22 0814   traZODone (DESYREL) tablet 50 mg  50 mg Oral QHS PRN Eligha Bridegroom, NP   50 mg at 07/24/22 2055    Lab Results:  Results for orders placed or  performed during the hospital encounter of 07/23/22 (from the past 48 hour(s))  Hemoglobin A1c     Status: None   Collection Time: 07/25/22  6:38 AM  Result Value Ref Range   Hgb A1c MFr Bld 5.2 4.8 - 5.6 %    Comment: (NOTE) Pre diabetes:  5.7%-6.4%  Diabetes:              >6.4%  Glycemic control for   <7.0% adults with diabetes    Mean Plasma Glucose 102.54 mg/dL    Comment: Performed at [facility], [address] [place], [place] [address]  Lipid panel     Status: Abnormal   Collection Time: 07/25/22  6:38 AM  Result Value Ref Range   Cholesterol 74 0 - 200 mg/dL   Triglycerides 89 <604 mg/dL   HDL 25 (L) >54 mg/dL   Total CHOL/HDL Ratio 3.0 RATIO   VLDL 18 0 - 40 mg/dL   LDL Cholesterol 31 0 - 99 mg/dL    Comment:        Total Cholesterol/HDL:CHD Risk Coronary Heart Disease Risk Table                     Men   Women  1/2 Average Risk   3.4   3.3  Average Risk       5.0   4.4  2 X Average Risk   9.6   7.1  3 X Average Risk  23.4   11.0        Use the calculated Patient Ratio above and the CHD Risk Table to determine the patient's CHD Risk.        ATP III CLASSIFICATION (LDL):  <100     mg/dL   Optimal  098-119  mg/dL   Near or Above                    Optimal  130-159  mg/dL   Borderline  147-829  mg/dL   High  >562     mg/dL   Very High Performed at [facility], [address], [place], [place] [address]     Blood Alcohol level:  Lab Results  Component Value Date   ETH <10 07/22/2022    Metabolic Disorder Labs: Lab Results  Component Value Date   HGBA1C 5.2 07/25/2022   MPG 102.54 07/25/2022     Physical Findings: CIWA:  CIWA-Ar Total: 0   Musculoskeletal: Strength & Muscle Tone: within normal limits Gait & Station: normal Patient leans: N/A  Psychiatric Specialty Exam:  Presentation  General Appearance:  Appropriate for Environment  Eye Contact: Good  Speech: Normal Rate  Speech  Volume: Normal  Handedness: Right   Mood and Affect  Mood: Euthymic  Affect: Labile   Thought Process  Thought Processes: Disorganized  Descriptions of Associations:Circumstantial  Orientation:Full (Time, Place and Person)  Thought Content:Logical; Scattered  History of Schizophrenia/Schizoaffective disorder:No Duration of Psychotic Symptoms:N/A Hallucinations:Hallucinations: None  Ideas of Reference:None  Suicidal Thoughts:Suicidal Thoughts: No  Homicidal Thoughts:Homicidal Thoughts: No   Sensorium  Memory: Recent Fair  Judgment: Fair  Insight: Poor   Executive Functions  Concentration: Fair  Attention Span: Fair  Recall: Fair  Fund of Knowledge: Fair  Language: Fair   Psychomotor Activity  Psychomotor Activity:Psychomotor Activity: Normal  Assets  Assets: Housing; Desire for Improvement; Other (comment) (positive attitude)   Sleep  Sleep:Sleep: Poor Number of Hours of Sleep: 3   Physical Exam Vitals and nursing note reviewed.  Constitutional:      Appearance: He is not ill-appearing.  HENT:     Head: Normocephalic and atraumatic.  Pulmonary:     Effort: Pulmonary effort is normal.  Neurological:     General: No focal deficit present.     Mental  Status: He is alert.   Review of Systems  Constitutional:  Negative for chills and fever.  Respiratory:  Negative for cough and shortness of breath.   Cardiovascular:  Negative for chest pain and palpitations.  Gastrointestinal:  Negative for constipation, diarrhea, nausea and vomiting.  Neurological:  Positive for weakness and headaches. Negative for dizziness and tremors.  Psychiatric/Behavioral:  Negative for depression, hallucinations and suicidal ideas. The patient has insomnia. The patient is not nervous/anxious.    Blood pressure since admission: Systolic blood pressures and 140 to 150s.  Diastolic in the 70s to 80s. Blood pressure (!) 140/74, pulse 64, temperature (!)  97.4 F (36.3 C), resp. rate 16, height 5\' 8"  (1.727 m), weight 107.2 kg, SpO2 99 %. Body mass index is 35.94 kg/m.   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  He is no longer experiencing auditory hallucinations, so he appears to be responding to Risperdal, although this just could be the natural history of his psychosis independent of the medication.  We attributed his headache, sedation, and weakness to his lack of sleep and to starting Risperdal.  The mild vertigo is unclear, but we will continue to monitor this symptom. His upper respiratory symptoms are responding to the loratadine so this is likely related to allergies and not an infection, and we will continue the antihistamine.  Regarding his blood pressure, he has been in the 140s to 150s.  [name] and I discussed lifestyle modification and try to refer to PCP for next steps if needed.  Regarding alcohol withdrawal, his CIWA scores have been 0 and 1 for 48 hours, so I am not concerned about withdrawal and would prefer him to be asleep instead be awoken to be assessed during the night.   PLAN: Safety and Monitoring:             -- Involuntary admission to inpatient psychiatric unit for safety, stabilization and treatment             -- Daily contact with patient to assess and evaluate symptoms and progress in treatment             -- Patient's case to be discussed in multi-disciplinary team meeting             -- Observation Level : q15 minute checks             -- Vital signs:  q12 hours             -- Precautions: suicide, elopement, and assault   2. Psychiatric Diagnoses and Treatment:  -- Continue  Risperdal 2 mg once at night for psychosis.  Reassess symptoms and side effects tomorrow.   -- Continue sertraline 50 mg once at night --  The risks/benefits/side-effects/alternatives to this medication were discussed in detail with the patient and time was given for questions. The patient consents to medication trial.              -- Metabolic profile and EKG monitoring obtained while on an atypical antipsychotic (BMI: 35, lipid Panel: Within normal limits except HDL, HbgA1c: 5.2, QTc: 376)              -- Encouraged patient to participate in unit milieu  and in scheduled group therapies              -- Short Term Goals: Ability to identify changes in lifestyle to reduce recurrence of condition will improve, Ability to verbalize feelings will improve, Ability to disclose and discuss suicidal ideas, Ability to demonstrate self-control will improve, Ability to identify and develop effective coping behaviors will improve, Ability to maintain clinical measurements within normal limits will improve, Compliance with prescribed medications will improve, and Ability to identify triggers associated with substance abuse/mental health issues will improve             -- Long Term Goals: Improvement in symptoms so as ready for discharge            3. Medical Issues Being Addressed:              -- Continue loratadine 10 mg once daily for allergies.  He normally takes cetirizine but it is not on formulary.    -- Discussed lifestyle modifications for improving blood pressures.  They are consistently past 160 systolic, we will start medication inpatient.  Will have his PCP follow-up.  If he does not do PCP, a Child psychotherapist or myself will connect him with one.   -- Regarding alcohol withdrawal, we are discontinuing CIWA protocol due to no symptoms or signs of any withdrawal since admission (3+ days). 5+ if including urgent care.  4. Discharge Planning:              -- Social work and case management to assist with  discharge planning and identification of hospital follow-up needs prior to discharge             -- Estimated LOS: 5-7 days              -- Discharge Concerns: Need to establish a safety plan; Medication compliance and effectiveness             -- Discharge Goals: Return home with outpatient referrals for mental health follow-up including medication management/psychotherapy   I certify that inpatient services furnished can reasonably be expected to improve the patient's condition.     Meryl Dare, MD 07/26/2022, 10:02 AM

## 2022-07-27 DIAGNOSIS — F332 Major depressive disorder, recurrent severe without psychotic features: Secondary | ICD-10-CM

## 2022-07-27 MED ORDER — AMLODIPINE BESYLATE 5 MG PO TABS
5.0000 mg | ORAL_TABLET | Freq: Every day | ORAL | Status: DC
  Administered 2022-07-27 – 2022-07-28 (×2): 5 mg via ORAL
  Filled 2022-07-27 (×4): qty 1

## 2022-07-27 NOTE — Progress Notes (Signed)
   07/27/22 1000  Psych Admission Type (Psych Patients Only)  Admission Status Voluntary  Psychosocial Assessment  Patient Complaints None  Eye Contact Fair  Facial Expression Animated  Affect Appropriate to circumstance  Speech Logical/coherent  Interaction Assertive  Motor Activity Other (Comment) (WNL)  Appearance/Hygiene Unremarkable  Behavior Characteristics Cooperative  Mood Pleasant  Thought Process  Coherency WDL  Content WDL  Delusions None reported or observed  Perception WDL  Hallucination None reported or observed  Judgment Limited  Confusion None  Danger to Self  Current suicidal ideation? Denies  Agreement Not to Harm Self Yes  Description of Agreement verbal  Danger to Others  Danger to Others None reported or observed

## 2022-07-27 NOTE — Progress Notes (Signed)
   07/27/22 2136  Psych Admission Type (Psych Patients Only)  Admission Status Voluntary  Psychosocial Assessment  Patient Complaints None  Eye Contact Fair  Facial Expression Animated  Affect Appropriate to circumstance  Speech Logical/coherent  Interaction Assertive  Motor Activity Other (Comment) (WDL)  Appearance/Hygiene Unremarkable  Behavior Characteristics Cooperative  Mood Pleasant  Thought Process  Coherency WDL  Content WDL  Delusions None reported or observed  Perception WDL  Hallucination None reported or observed  Judgment Limited  Confusion None  Danger to Self  Current suicidal ideation? Denies  Agreement Not to Harm Self Yes  Description of Agreement verbal  Danger to Others  Danger to Others None reported or observed

## 2022-07-27 NOTE — Group Note (Signed)
Date:  07/27/2022 Time:  3:34 PM  Group Topic/Focus:  Diagnosis Education:   The focus of this group is to discuss the major disorders that patients maybe diagnosed with.  Group discusses the importance of knowing what one's diagnosis is so that one can understand treatment and better advocate for oneself.    Participation Level:  Did Not Attend    Harriet Masson 07/27/2022, 3:34 PM

## 2022-07-27 NOTE — [facility] Suicide Risk Assessment (Addendum)
Suicide Risk Assessment  Discharge Assessment    [facility] Discharge Suicide Risk Assessment   Principal Problem: MDD (major depressive disorder), recurrent severe, without psychosis (HCC) Discharge Diagnoses: Principal Problem:   MDD (major depressive disorder), recurrent severe, without psychosis (HCC) Active Problems:   Psychoactive substance-induced psychosis (HCC)   Cannabis abuse   Alcohol abuse  During the patient's hospitalization, patient had extensive initial psychiatric evaluation, and follow-up psychiatric evaluations every day.  Psychiatric diagnoses provided upon initial assessment: MDD, substance-induced psychosis  Patient's psychiatric medications were adjusted on admission: Continued sertraline 50 once daily  During the hospitalization, other adjustments were made to the patient's psychiatric medication regimen: Started Risperdal 2 mg once at night  Patient's care was discussed during the interdisciplinary team meeting every day during the hospitalization.  The patient is not having side effects to prescribed psychiatric medication.  Gradually, patient started adjusting to milieu. The patient was evaluated each day by a clinical provider to ascertain response to treatment. Improvement was noted by the patient's report of decreasing symptoms, improved sleep and appetite, affect, medication tolerance, behavior, and participation in unit programming.  Patient was asked each day to complete a self inventory noting mood, mental status, pain, new symptoms, anxiety and concerns.   Symptoms were reported as significantly decreased or resolved completely by discharge.  The patient reports that their mood is stable.  The patient denied having suicidal thoughts for more than 48 hours prior to discharge.  Patient denies having homicidal thoughts.  Patient denies having auditory hallucinations.  Patient denies any visual hallucinations or other symptoms of psychosis.  The patient was  motivated to continue taking medication with a goal of continued improvement in mental health.   The patient reports their target psychiatric symptoms of auditory hallucinations responded well to the psychiatric medications, and the patient reports overall benefit other psychiatric hospitalization. Supportive psychotherapy was provided to the patient. The patient also participated in regular group therapy while hospitalized. Coping skills, problem solving as well as relaxation therapies were also part of the unit programming.  Labs were reviewed with the patient, and abnormal results were discussed with the patient.  The patient is able to verbalize their individual safety plan to this provider.  # It is recommended to the patient to continue psychiatric medications as prescribed, after discharge from the hospital.    # It is recommended to the patient to follow up with your outpatient psychiatric provider and PCP.  # It was discussed with the patient, the impact of alcohol, drugs, tobacco have been there overall psychiatric and medical wellbeing, and total abstinence from substance use was recommended the patient.ed.  # Prescriptions provided or sent directly to preferred pharmacy at discharge. Patient agreeable to plan. Given opportunity to ask questions. Appears to feel comfortable with discharge.    # In the event of worsening symptoms, the patient is instructed to call the crisis hotline, 911 and or go to the nearest ED for appropriate evaluation and treatment of symptoms. To follow-up with primary care provider for other medical issues, concerns and or health care needs  # Patient was discharged home with grandma with a plan to follow up as noted below.    On day of discharge patient denies any SI/HI/AVH. He reports that he is going home with his grandmother and looking forward to it.  Total Time spent with patient: 15 minutes  Musculoskeletal: Strength & Muscle Tone: within normal  limits Gait & Station: normal Patient leans: N/A  Psychiatric Specialty Exam  Presentation  General Appearance:  Appropriate for Environment  Eye Contact: Good  Speech: Normal Rate  Speech Volume: Normal  Handedness: Right   Mood and Affect  Mood: Euthymic  Duration of Depression Symptoms: No data recorded Affect: Appropriate   Thought Process  Thought Processes: Coherent  Descriptions of Associations:Intact  Orientation:Full (Time, Place and Person)  Thought Content:Logical  History of Schizophrenia/Schizoaffective disorder:No  Duration of Psychotic Symptoms:N/A  Hallucinations:Hallucinations: None  Ideas of Reference:None  Suicidal Thoughts:Suicidal Thoughts: No  Homicidal Thoughts:Homicidal Thoughts: No   Sensorium  Memory: Recent Good  Judgment: Fair  Insight: Fair   Art therapist  Concentration: Good  Attention Span: Good  Recall: Good  Fund of Knowledge: Good  Language: Good   Psychomotor Activity  Psychomotor Activity: Psychomotor Activity: Normal   Assets  Assets: Desire for Improvement; Housing; Social Support; Transportation   Sleep  Sleep: Sleep: Good Number of Hours of Sleep: 9   Physical Exam: Physical Exam  ROS  Blood pressure (!) 143/76, pulse 64, temperature 97.9 F (36.6 C), temperature source Oral, resp. rate 16, height 5\' 8"  (1.727 m), weight 107.2 kg, SpO2 100 %. Body mass index is 35.94 kg/m.  Mental Status Per Nursing Assessment::   On Admission:  NA  Demographic Factors:  Male, Low socioeconomic status, and Unemployed  Loss Factors: Financial problems/change in socioeconomic status  Historical Factors: NA  Risk Reduction Factors:   Sense of responsibility to family, Living with another person, especially a relative, Positive social support, and Positive therapeutic relationship  Continued Clinical Symptoms:  More than one psychiatric diagnosis  Cognitive Features  That Contribute To Risk:  None    Suicide Risk:  Minimal: No identifiable suicidal ideation.  Patients presenting with no risk factors but with morbid ruminations; may be classified as minimal risk based on the severity of the depressive symptoms   Follow-up Information     [facility]. Go to.   Specialty: Behavioral Health Why: For fastest service, please use walk in service at 7:00 am, Monday through Friday to obtain therapy services. Contact information: [address] [place] [address] [phone]        [facility] Go on 08/15/2022.   Why: You have an appointment for medication management services on  08/15/22 at 4:00 pm.  This appointment will be held in person. Contact information: [address] [place] [address] [phone]                 Plan Of Care/Follow-up recommendations:  Activity: as tolerated  Diet: heart healthy  Other: -Follow-up with your outpatient psychiatric provider -instructions on appointment date, time, and address (location) are provided to you in discharge paperwork.  -Take your psychiatric medications as prescribed at discharge - instructions are provided to you in the discharge paperwork  -Follow-up with outpatient primary care doctor and other specialists -for management of chronic medical disease, including: Hypertension  -Testing: Follow-up with outpatient provider for abnormal lab results: None  -Recommend abstinence from alcohol, tobacco, and other illicit drug use at discharge.   -If your psychiatric symptoms recur, worsen, or if you have side effects to your psychiatric medications, call your outpatient psychiatric provider, 911, 988 or go to the nearest emergency department.  -If suicidal thoughts recur, call your outpatient psychiatric provider, 911, 988 or go to the nearest emergency department.   Meryl Dare, MD 07/27/2022, 3:43 PM

## 2022-07-27 NOTE — [facility] Group Notes (Signed)
Adult Psychoeducational Group Note  Date:  07/27/2022 Time:  11:30 AM  Group Topic/Focus:  Goals Group:   The focus of this group is to help patients establish daily goals to achieve during treatment and discuss how the patient can incorporate goal setting into their daily lives to aide in recovery. Orientation:   The focus of this group is to educate the patient on the purpose and policies of crisis stabilization and provide a format to answer questions about their admission.  The group details unit policies and expectations of patients while admitted.  Participation Level:  Did Not Attend  Participation Quality:    Affect:    Cognitive:    Insight:   Engagement in Group:    Modes of Intervention:    Additional Comments:  Pt did not attend group today  Baeleigh Devincent 07/27/2022, 11:30 AM

## 2022-07-27 NOTE — Discharge Instructions (Addendum)
Dear Justin Anderson,  It was a pleasure to take care of you during your stay at [facility] where you were treated for your substance induced psychosis.  While you were here, you were:  observed and cared for by our nurses and nursing assistants  treated with medications by your psychiatrists  provided individual and group therapy by therapists  provided resources by our social workers and case managers  Please review the medication list provided to you at discharge and stop, start taking, or continue taking the medications listed there.I started you on Risperdal 2mg  once at night & amlodipine 5mg  once daily (you discuss morning dosing)  You should also follow-up with your primary care doctor, or start seeing one if you don't have one yet. If applicable, here are some scheduled follow-ups for you: PCP INFORMATION --> [email or web address] Or GOOGLE "Find PCP [place] Medicaid" --> Find a Doctor  [place] [employer]  Follow-up Information     [facility]. Go to.   Specialty: Behavioral Health Why: For fastest service, please use walk in service at 7:00 am, Monday through Friday to obtain therapy services. Contact information: [address] [place] [address] [phone]        [facility] Go on 08/15/2022.   Why: You have an appointment for medication management services on  08/15/22 at 4:00 pm.  This appointment will be held in person. Contact information: [address] [place] [address] [phone]                  I recommend abstinence from alcohol, tobacco, and other illicit drug use.   If your psychiatric symptoms or suicidal thoughts recur, worsen, or if you have side effects to your psychiatric medications, call your outpatient psychiatric provider, 911, 988 or go to the nearest emergency department.  Take care!  Signed: Meryl Dare,  MD 07/27/2022, 3:24 PM  Naloxone (Narcan) can help reverse an overdose when given to the victim quickly.  [place] offers free naloxone kits and instructions/training on its use.  Add naloxone to your first aid kit and you can help save a life. A prescription can be filled at your local pharmacy or free kits are provided by the county.  Pick up your free kit at the following locations:   [place]:  [facility], [address] [place] [address] [phone]) [facility] [address] [place] [address] [phone]) [facility] [address] [place] [address]  [place]: [facility] [address] [place] [address] [phone]) [facility] [address] [place] [address] [phone])

## 2022-07-27 NOTE — Progress Notes (Addendum)
[facility] MD Progress Note  07/27/2022 12:47 PM Justin Anderson  MRN:  [id number]  Subjective:   Justin Anderson is a 22 y.o., male who denies having any formally diagnosed past psychiatric history, who was admitted to the [facility] from [facility] ED, for evaluation and management of suicidal ideation, homicidal thoughts, auditory hallucinations, paranoia, and agitation. He was transferred voluntarily from the ED to the [facility].   Collateral Wonda Horner, [phone]): Updated her on [place].  She noted that she came to see him last night and that he returned to baseline.  We discussed Risperdal and how it help prevent auditory hallucinations and help with sleep if taken before bed.  I informed her that we will set up outpatient psychiatry for [facility].  I discussed the importance of establishing a PCP for [facility]; she asked for support around this.  I told her that either social work or myself will include information for a PCP that accepts and see Medicaid and his paperwork.  She agrees to pick Cleo up tomorrow at 230 and supporting him as he returns to live and to monitor for any return of psychosis, SI, HI, agitation.  Case was discussed in the multidisciplinary team. MAR was reviewed and patient is compliant with medications.   Psychiatric Team made the following recommendations yesterday: Continue risperdal 2mg  once at bedtime  Continue sertraline 50mg  once at bedtime   Today 07/27/2022, Justin Anderson reports no concerns.  He to experience no auditory hallucinations.  His headache, sedation, mild vertigo, and weakness has improved.  Since discontinuing his CIWA protocol and not being woken up during the night, he has been sleeping very well and was awake earlier today than his usual (instead of sleeping in).  He is eating and drinking okay.  He reports no SI, HI, AVH.  Reports no side effects to medication.   Principal Problem: MDD (major depressive disorder), recurrent severe, without  psychosis (HCC) Diagnosis: Principal Problem:   MDD (major depressive disorder), recurrent severe, without psychosis (HCC) Active Problems:   Psychoactive substance-induced psychosis (HCC)   Cannabis abuse   Alcohol abuse   Total Time spent with patient: 15 minutes  Past Psychiatric History: no formally diagnosed past psychiatric history  Past Medical History:  Past Medical History:  Diagnosis Date   Environmental allergies    History reviewed. No pertinent surgical history. Family History:  Family History  Problem Relation Age of Onset   Hypertension Mother    Diabetes type II Father    Family Psychiatric  History: none Social History:  Social History   Substance and Sexual Activity  Alcohol Use Yes   Alcohol/week: 4.0 standard drinks of alcohol   Types: 4 Shots of liquor per week     Social History   Substance and Sexual Activity  Drug Use Yes   Types: Marijuana    Social History   Socioeconomic History   Marital status: Single    Spouse name: Not on file   Number of children: Not on file   Years of education: Not on file   Highest education level: Not on file  Occupational History   Not on file  Tobacco Use   Smoking status: Every Day    Types: Cigars   Smokeless tobacco: Never  Vaping Use   Vaping Use: Never used  Substance and Sexual Activity   Alcohol use: Yes    Alcohol/week: 4.0 standard drinks of alcohol    Types: 4 Shots of liquor per week   Drug use:  Yes    Types: Marijuana   Sexual activity: Not Currently  Other Topics Concern   Not on file  Social History Narrative   Not on file   Social Determinants of Health   Financial Resource Strain: Not on file  Food Insecurity: No Food Insecurity (07/23/2022)   Hunger Vital Sign    Worried About Running Out of Food in the Last Year: Never true    Ran Out of Food in the Last Year: Never true  Transportation Needs: No Transportation Needs (07/23/2022)   PRAPARE - Scientist, research (physical sciences) (Medical): No    Lack of Transportation (Non-Medical): No  Physical Activity: Not on file  Stress: Not on file  Social Connections: Not on file   Additional Social History: see H&P      Sleep: Good 8 hours on uninterrupted sleep  Appetite:  Good  Current Medications: Current Facility-Administered Medications  Medication Dose Route Frequency Provider Last Rate Last Admin   acetaminophen (TYLENOL) tablet 650 mg  650 mg Oral Q6H PRN Eligha Bridegroom, NP       alum & mag hydroxide-simeth (MAALOX/MYLANTA) 200-200-20 MG/5ML suspension 30 mL  30 mL Oral Q4H PRN Eligha Bridegroom, NP       cloNIDine (CATAPRES) tablet 0.1 mg  0.1 mg Oral Q4H PRN Massengill, Harrold Donath, MD   0.1 mg at 07/27/22 0716   haloperidol (HALDOL) tablet 5 mg  5 mg Oral TID PRN Phineas Inches, MD       And   LORazepam (ATIVAN) tablet 2 mg  2 mg Oral TID PRN Phineas Inches, MD       And   diphenhydrAMINE (BENADRYL) capsule 50 mg  50 mg Oral TID PRN Massengill, Harrold Donath, MD       haloperidol lactate (HALDOL) injection 5 mg  5 mg Intramuscular TID PRN Massengill, Harrold Donath, MD       And   LORazepam (ATIVAN) injection 2 mg  2 mg Intramuscular TID PRN Massengill, Harrold Donath, MD       And   diphenhydrAMINE (BENADRYL) injection 50 mg  50 mg Intramuscular TID PRN Massengill, Harrold Donath, MD       hydrOXYzine (ATARAX) tablet 25 mg  25 mg Oral Q6H PRN Meryl Dare, MD   25 mg at 07/24/22 2055   loperamide (IMODIUM) capsule 2-4 mg  2-4 mg Oral PRN Meryl Dare, MD   2 mg at 07/24/22 2055   loratadine (CLARITIN) tablet 10 mg  10 mg Oral Daily Meryl Dare, MD   10 mg at 07/27/22 1610   LORazepam (ATIVAN) tablet 1 mg  1 mg Oral Q6H PRN Meryl Dare, MD       magnesium hydroxide (MILK OF MAGNESIA) suspension 30 mL  30 mL Oral Daily PRN Eligha Bridegroom, NP       multivitamin with minerals tablet 1 tablet  1 tablet Oral Daily Meryl Dare, MD   1 tablet at 07/27/22 9604   nicotine polacrilex (NICORETTE) gum 2 mg  2  mg Oral PRN Bobbitt, Shalon E, NP       ondansetron (ZOFRAN-ODT) disintegrating tablet 4 mg  4 mg Oral Q6H PRN Meryl Dare, MD       risperiDONE (RISPERDAL) tablet 2 mg  2 mg Oral QHS Meryl Dare, MD   2 mg at 07/26/22 2128   sertraline (ZOLOFT) tablet 50 mg  50 mg Oral Daily Eligha Bridegroom, NP   50 mg at 07/27/22 0833   thiamine (Vitamin B-1) tablet 100 mg  100  mg Oral Daily Meryl Dare, MD   100 mg at 07/27/22 0981   traZODone (DESYREL) tablet 50 mg  50 mg Oral QHS PRN Eligha Bridegroom, NP   50 mg at 07/26/22 2128    Lab Results:  No results found for this or any previous visit (from the past 48 hour(s)).   Blood Alcohol level:  Lab Results  Component Value Date   ETH <10 07/22/2022    Metabolic Disorder Labs: Lab Results  Component Value Date   HGBA1C 5.2 07/25/2022   MPG 102.54 07/25/2022     Physical Findings: CIWA:  CIWA-Ar Total: 0   Musculoskeletal: Strength & Muscle Tone: within normal limits Gait & Station: normal Patient leans: N/A  Psychiatric Specialty Exam:  Presentation  General Appearance:  Appropriate for Environment  Eye Contact: Good  Speech: Normal Rate  Speech Volume: Normal  Handedness: Right   Mood and Affect  Mood: Euthymic  Affect: Appropriate   Thought Process  Thought Processes: Coherent  Descriptions of Associations:Intact  Orientation:Full (Time, Place and Person)  Thought Content:Logical  History of Schizophrenia/Schizoaffective disorder:No Duration of Psychotic Symptoms:N/A Hallucinations:Hallucinations: None  Ideas of Reference:None  Suicidal Thoughts:Suicidal Thoughts: No  Homicidal Thoughts:Homicidal Thoughts: No   Sensorium  Memory: Recent Good  Judgment: Fair  Insight: Fair   Art therapist  Concentration: Good  Attention Span: Good  Recall: Good  Fund of Knowledge: Good  Language: Good   Psychomotor Activity  Psychomotor Activity:Psychomotor Activity:  Normal  Assets  Assets: Desire for Improvement; Housing; Social Support; Transportation   Sleep  Sleep:Sleep: Good Number of Hours of Sleep: 9   Physical Exam Vitals and nursing note reviewed.  Constitutional:      Appearance: He is not ill-appearing.  HENT:     Head: Normocephalic and atraumatic.  Pulmonary:     Effort: Pulmonary effort is normal.  Neurological:     General: No focal deficit present.     Mental Status: He is alert.    Review of Systems  Constitutional:  Negative for chills and fever.  Respiratory:  Negative for cough and shortness of breath.   Cardiovascular:  Negative for chest pain and palpitations.  Gastrointestinal:  Negative for constipation, diarrhea, nausea and vomiting.  Neurological:  Negative for dizziness, tremors, weakness and headaches.  Psychiatric/Behavioral:  Negative for depression, hallucinations and suicidal ideas. The patient is not nervous/anxious and does not have insomnia.    Blood pressure since admission: Systolic blood pressures and 140 to 150s.  Diastolic in the 70s to 80s. Blood pressure (!) 145/75, pulse 64, temperature 97.9 F (36.6 C), temperature source Oral, resp. rate 16, height 5\' 8"  (1.727 m), weight 107.2 kg, SpO2 100 %. Body mass index is 35.94 kg/m.   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  He continues to report no medication side effects and we discussed side effects to look for including loss of libido, sexual functioning, issues with movement, restlessness, tremors.   He had some nonspecific symptoms of headache, sedation, weakness, vertigo yesterday, but these have since resolved.  we talked about his blood pressures being elevated and how he needs to follow with a PCP, which will be included in his discharge paperwork from social work or myself.  We also discussed lifestyle modifications including diet, exercise, stress reduction, etc.  Furthermore we discussed how he may have a genetic predisposition to psychosis and how cannabis could trigger psychosis.  Therefore, we recommended that he discontinue all cannabis use and avoid hallucinogenic substances as well.  He demonstrated his understanding this via teach back method.  I communicated this to his grandmother as well.  PLAN: Safety and Monitoring:             -- Involuntary admission to inpatient psychiatric unit for safety, stabilization and treatment             -- Daily contact with patient to assess and evaluate symptoms and progress in treatment             -- Patient's case to be discussed in multi-disciplinary team meeting             -- Observation Level : q15 minute checks             -- Vital signs:  q12 hours             -- Precautions: suicide, elopement, and assault   2. Psychiatric Diagnoses and Treatment:  -- Continue Risperdal 2 mg once at night for maintenance of psychosis.  Follow-up with outpatient psychiatry.  -- Continue sertraline 50 mg once at night.  Follow-up with outpatient psychiatry. --  The risks/benefits/side-effects/alternatives to this medication were discussed in detail with the patient and time was given for questions. The patient consents to medication trial.              -- Metabolic profile and EKG monitoring obtained while on an atypical antipsychotic (BMI: 35, lipid Panel: Within normal limits except HDL, HbgA1c: 5.2, QTc: 376)              -- Encouraged patient to participate in unit milieu and in scheduled group therapies              -- Short Term Goals: Ability to identify changes in lifestyle to reduce recurrence of condition will improve, Ability to verbalize feelings will  improve, Ability to disclose and discuss suicidal ideas, Ability to demonstrate self-control will improve, Ability to identify and develop effective coping behaviors will improve, Ability to maintain clinical measurements within normal limits will improve, Compliance with prescribed medications will improve, and Ability to identify triggers associated with substance abuse/mental health issues will improve             -- Long Term Goals: Improvement in symptoms so as ready for discharge            3. Medical Issues Being Addressed:              -- Continue loratadine 10 mg once daily for allergies.  He normally takes cetirizine but it is not on formulary.    -- Outpatient follow-up for blood pressure.  Will include information for getting established with a PCP that accepts and see Medicaid. 4. Discharge Planning:              -- Social work and case management to assist with discharge planning and identification of hospital follow-up needs prior to discharge             -- Discharge scheduled for tomorrow at 2:30 PM via grandmom.             -- Discharge Concerns: Need to establish a safety plan; Medication compliance and effectiveness             --  Discharge Goals: Return home with outpatient referrals for mental health follow-up including medication management/psychotherapy   I certify that inpatient services furnished can reasonably be expected to improve the patient's condition.     Meryl Dare, MD 07/27/2022, 12:47 PM

## 2022-07-27 NOTE — Discharge Summary (Signed)
Physician Discharge Summary Note  Patient:  Justin Anderson is an 22 y.o., male MRN:  [id number] DOB:  08-31-2000 Patient phone:  [phone] (home)  Patient address:   [address] [place] [address],  Total Time spent with patient: 15 minutes  Date of Admission:  07/23/2022 Date of Discharge: 07/28/2022  Reason for Admission:   Justin Anderson is a 22 y.o., male who denies having any formally diagnosed past psychiatric history, who was admitted to the [facility] from [facility] ED, for evaluation and management of suicidal ideation, homicidal thoughts, auditory hallucinations, paranoia, and agitation. He was transferred voluntarily from the ED to the [facility].   Principal Problem: MDD (major depressive disorder), recurrent severe, without psychosis (HCC) Discharge Diagnoses: Principal Problem:   MDD (major depressive disorder), recurrent severe, without psychosis (HCC) Active Problems:   Psychoactive substance-induced psychosis (HCC)   Cannabis abuse   Alcohol abuse   Past Psychiatric History: No formally diagnosed past psychiatric history  Past Medical History:  Past Medical History:  Diagnosis Date   Environmental allergies    History reviewed. No pertinent surgical history. Family History:  Family History  Problem Relation Age of Onset   Hypertension Mother    Diabetes type II Father    Family Psychiatric  History: None Social History:  Social History   Substance and Sexual Activity  Alcohol Use Yes   Alcohol/week: 4.0 standard drinks of alcohol   Types: 4 Shots of liquor per week     Social History   Substance and Sexual Activity  Drug Use Yes   Types: Marijuana    Social History   Socioeconomic History   Marital status: Single    Spouse name: Not on file   Number of children: Not on file   Years of education: Not on file   Highest education level: Not on file  Occupational History   Not on file  Tobacco Use   Smoking status: Every Day     Types: Cigars   Smokeless tobacco: Never  Vaping Use   Vaping Use: Never used  Substance and Sexual Activity   Alcohol use: Yes    Alcohol/week: 4.0 standard drinks of alcohol    Types: 4 Shots of liquor per week   Drug use: Yes    Types: Marijuana   Sexual activity: Not Currently  Other Topics Concern   Not on file  Social History Narrative   Not on file   Social Determinants of Health   Financial Resource Strain: Not on file  Food Insecurity: No Food Insecurity (07/23/2022)   Hunger Vital Sign    Worried About Running Out of Food in the Last Year: Never true    Ran Out of Food in the Last Year: Never true  Transportation Needs: No Transportation Needs (07/23/2022)   PRAPARE - Administrator, Civil Service (Medical): No    Lack of Transportation (Non-Medical): No  Physical Activity: Not on file  Stress: Not on file  Social Connections: Not on file    Hospital Course:   During the patient's hospitalization, patient had extensive initial psychiatric evaluation, and follow-up psychiatric evaluations every day.   Psychiatric diagnoses provided upon initial assessment: MDD, substance-induced psychosis   Patient's psychiatric medications were adjusted on admission: Continued sertraline 50 once daily   During the hospitalization, other adjustments were made to the patient's psychiatric medication regimen: Started Risperdal 2 mg once at night   Patient's care was discussed during the interdisciplinary team meeting every  day during the hospitalization.   The patient is not having side effects to prescribed psychiatric medication.   Gradually, patient started adjusting to milieu. The patient was evaluated each day by a clinical provider to ascertain response to treatment. Improvement was noted by the patient's report of decreasing symptoms, improved sleep and appetite, affect, medication tolerance, behavior, and participation in unit programming.  Patient was asked each  day to complete a self inventory noting mood, mental status, pain, new symptoms, anxiety and concerns.   Symptoms were reported as significantly decreased or resolved completely by discharge.  The patient reports that their mood is stable.  The patient denied having suicidal thoughts for more than 48 hours prior to discharge.  Patient denies having homicidal thoughts.  Patient denies having auditory hallucinations.  Patient denies any visual hallucinations or other symptoms of psychosis.  The patient was motivated to continue taking medication with a goal of continued improvement in mental health.    The patient reports their target psychiatric symptoms of auditory hallucinations responded well to the psychiatric medications, and the patient reports overall benefit other psychiatric hospitalization. Supportive psychotherapy was provided to the patient. The patient also participated in regular group therapy while hospitalized. Coping skills, problem solving as well as relaxation therapies were also part of the unit programming.   Labs were reviewed with the patient, and abnormal results were discussed with the patient.   The patient is able to verbalize their individual safety plan to this provider.   # It is recommended to the patient to continue psychiatric medications as prescribed, after discharge from the hospital.     # It is recommended to the patient to follow up with your outpatient psychiatric provider and PCP.   # It was discussed with the patient, the impact of alcohol, drugs, tobacco have been there overall psychiatric and medical wellbeing, and total abstinence from substance use was recommended the patient.ed.   # Prescriptions provided or sent directly to preferred pharmacy at discharge. Patient agreeable to plan. Given opportunity to ask questions. Appears to feel comfortable with discharge.    # In the event of worsening symptoms, the patient is instructed to call the crisis  hotline, 911 and or go to the nearest ED for appropriate evaluation and treatment of symptoms. To follow-up with primary care provider for other medical issues, concerns and or health care needs   # Patient was discharged home with grandma with a plan to follow up as noted below.     Psychiatric Specialty Exam:  Presentation  General Appearance:  Appropriate for Environment  Eye Contact: Good  Speech: Normal Rate  Speech Volume: Normal  Handedness: Right   Mood and Affect  Mood: Euthymic  Affect: Appropriate   Thought Process  Thought Processes: Coherent  Descriptions of Associations:Intact  Orientation:Full (Time, Place and Person)  Thought Content:Logical  History of Schizophrenia/Schizoaffective disorder:No  Duration of Psychotic Symptoms:N/A  Hallucinations:Hallucinations: None  Ideas of Reference:None  Suicidal Thoughts:Suicidal Thoughts: No  Homicidal Thoughts:Homicidal Thoughts: No   Sensorium  Memory: Recent Good  Judgment: Fair  Insight: Fair   Art therapist  Concentration: Good  Attention Span: Good  Recall: Good  Fund of Knowledge: Good  Language: Good   Psychomotor Activity  Psychomotor Activity: Psychomotor Activity: Normal   Assets  Assets: Desire for Improvement; Housing; Social Support; Transportation   Sleep  Sleep: Sleep: Good Number of Hours of Sleep: 9    Physical Exam: Physical Exam *** ROS *** Blood pressure (!) 143/76,  pulse 64, temperature 97.9 F (36.6 C), temperature source Oral, resp. rate 16, height 5\' 8"  (1.727 m), weight 107.2 kg, SpO2 100 %. Body mass index is 35.94 kg/m.   Social History   Tobacco Use  Smoking Status Every Day   Types: Cigars  Smokeless Tobacco Never   Tobacco Cessation:  A prescription for an FDA-approved tobacco cessation medication was offered at discharge and the patient refused   Blood Alcohol level:  Lab Results  Component Value Date    ETH <10 07/22/2022    Metabolic Disorder Labs:  Lab Results  Component Value Date   HGBA1C 5.2 07/25/2022   MPG 102.54 07/25/2022   No results found for: "PROLACTIN" Lab Results  Component Value Date   CHOL 74 07/25/2022   TRIG 89 07/25/2022   HDL 25 (L) 07/25/2022   CHOLHDL 3.0 07/25/2022   VLDL 18 07/25/2022   LDLCALC 31 07/25/2022    See Psychiatric Specialty Exam and Suicide Risk Assessment completed by Attending Physician prior to discharge.  Discharge destination:  Home  Is patient on multiple antipsychotic therapies at discharge:  No   Has Patient had three or more failed trials of antipsychotic monotherapy by history:  No  Recommended Plan for Multiple Antipsychotic Therapies: NA   Allergies as of 07/27/2022       Reactions   Pollen Extract      Med Rec must be completed prior to using this [facility]***       Follow-up Information     [facility]. Go to.   Specialty: Behavioral Health Why: For fastest service, please use walk in service at 7:00 am, Monday through Friday to obtain therapy services. Contact information: [address] [place] [address] [phone]        [facility] Go on 08/15/2022.   Why: You have an appointment for medication management services on  08/15/22 at 4:00 pm.  This appointment will be held in person. Contact information: [address] [place] [address] [phone]                 Follow-up recommendations:   Activity: as tolerated  Diet: heart healthy  Other: -Follow-up with your outpatient psychiatric provider -instructions on appointment date, time, and address (location) are provided to you in discharge paperwork.  -Take your psychiatric medications as prescribed at discharge - instructions are provided to you in the discharge paperwork  -Follow-up with outpatient primary care doctor and other specialists -for management of chronic  medical disease, including: Hypertension  -Testing: Follow-up with outpatient provider for abnormal lab results: None  -Recommend abstinence from alcohol, tobacco, and other illicit drug use at discharge.   -If your psychiatric symptoms recur, worsen, or if you have side effects to your psychiatric medications, call your outpatient psychiatric provider, 911, [phone] or go to the nearest emergency department.  -If suicidal thoughts recur, call your outpatient psychiatric provider, 911, [phone] or go to the nearest emergency department.   Signed: Meryl Dare, MD 07/27/2022, 3:55 PM

## 2022-07-27 NOTE — Progress Notes (Signed)
   07/27/22 2136  Charting Type  Charting Type Shift assessment  Safety Check Verification  Has the RN verified the 15 minute safety check completion? Yes  Neurological  Neuro (WDL) WDL  HEENT  HEENT (WDL) WDL  Respiratory  Respiratory (WDL) WDL  Cardiac  Cardiac (WDL) X (HTN)  Vascular  Vascular (WDL) WDL  Integumentary  Integumentary (WDL) WDL  Braden Scale (Ages 8 and up)  Sensory Perceptions 4  Moisture 4  Activity 4  Mobility 4  Nutrition 3  Friction and Shear 3  Braden Scale Score 22  Musculoskeletal  Musculoskeletal (WDL) WDL  Assistive Device None  Gastrointestinal  Gastrointestinal (WDL) WDL  GU Assessment  Genitourinary (WDL) WDL  Neurological  Level of Consciousness Alert

## 2022-07-27 NOTE — Group Note (Signed)
Recreation Therapy Group Note   Group Topic:Team Building  Group Date: 07/27/2022 Start Time: 0932 End Time: 1000 Facilitators: Celene Pippins-McCall, LRT,CTRS Location: [address] Day[address]   Goal Area(s) Addresses:  Patient will effectively work with peer towards shared goal.  Patient will identify skills used to make activity successful.  Patient will identify how skills used during activity can be used to reach post d/c goals.   Group Description: Straw Bridge. In teams of 3-5, patients were given 15 plastic drinking straws and an equal length of masking tape. Using the materials provided, patients were instructed to build a free standing bridge-like structure to suspend an everyday item (ex: puzzle box) off of the floor or table surface. All materials were required to be used by the team in their design. LRT facilitated post-activity discussion reviewing team process. Patients were encouraged to reflect how the skills used in this activity can be generalized to daily life post discharge.    Affect/Mood: N/A   Participation Level: Did not attend    Clinical Observations/Individualized Feedback:     Plan: Continue to engage patient in RT group sessions 2-3x/week.   Anberlin Diez-McCall, LRT,CTRS 07/27/2022 12:20 PM

## 2022-07-28 MED ORDER — RISPERIDONE 2 MG PO TABS: 2.0000 mg | ORAL_TABLET | Freq: Every day | ORAL | 0 refills | Status: AC

## 2022-07-28 MED ORDER — SERTRALINE HCL 50 MG PO TABS: 50.0000 mg | ORAL_TABLET | Freq: Every day | ORAL | 0 refills | Status: DC

## 2022-07-28 MED ORDER — KETOCONAZOLE 2 % EX SHAM: 1.0000 | MEDICATED_SHAMPOO | CUTANEOUS | 0 refills | Status: DC

## 2022-07-28 MED ORDER — CLOTRIMAZOLE 1 % EX CREA: TOPICAL_CREAM | CUTANEOUS | 0 refills | Status: DC

## 2022-07-28 MED ORDER — AMLODIPINE BESYLATE 5 MG PO TABS: 5.0000 mg | ORAL_TABLET | Freq: Every day | ORAL | 0 refills | Status: DC

## 2022-07-28 NOTE — Progress Notes (Signed)
   07/28/22 0942  Psych Admission Type (Psych Patients Only)  Admission Status Voluntary  Psychosocial Assessment  Patient Complaints None  Eye Contact Fair  Facial Expression Animated  Affect Silly;Appropriate to circumstance  Speech Logical/coherent  Interaction Assertive;Attention-seeking  Motor Activity Other (Comment) (WDL)  Appearance/Hygiene Unremarkable  Behavior Characteristics Cooperative  Mood Pleasant  Thought Process  Coherency WDL  Content WDL  Delusions None reported or observed  Perception WDL  Hallucination None reported or observed  Judgment Limited  Confusion None  Danger to Self  Current suicidal ideation? Denies  Agreement Not to Harm Self Yes  Description of Agreement verbal  Danger to Others  Danger to Others None reported or observed

## 2022-07-28 NOTE — Progress Notes (Signed)
  [facility] Adult Case Management Discharge Plan :  Will you be returning to the same living situation after discharge:  Yes,  with grandmother At discharge, do you have transportation home?: Yes,  grandmother Do you have the ability to pay for your medications: Yes,  has Medicaid  Release of information consent forms completed and in the chart;  Patient's signature needed at discharge.  Patient to Follow up at:  Follow-up Information     [facility]. Go to.   Specialty: Behavioral Health Why: For fastest service, please use walk in service at 7:00 am, Monday through Friday to obtain therapy services. Contact information: [address] [place] [address] [phone]        [facility] Go on 08/15/2022.   Why: You have an appointment for medication management services on  08/15/22 at 4:00 pm.  This appointment will be held in person. Contact information: [address] [place] [address] [phone]                 Next level of care provider has access to [facility] Link:yes  Safety Planning and Suicide Prevention discussed: Yes,  w/ grandmother     Has patient been referred to the Quitline?: Yes, faxed/e-referral on 07/28/2022  Patient has been referred for addiction treatment: Yes, the patient will follow up with an outpatient provider for substance use disorder. Psychiatrist/APP: appointment made  Otelia Santee, LCSW 07/28/2022, 9:51 AM

## 2022-07-28 NOTE — BHH Group Notes (Signed)
Adult Psychoeducational Group Note  Date:  07/28/2022 Time:  1:56 PM  Group Topic/Focus:  Goals Group:   The focus of this group is to help patients establish daily goals to achieve during treatment and discuss how the patient can incorporate goal setting into their daily lives to aide in recovery. Orientation:   The focus of this group is to educate the patient on the purpose and policies of crisis stabilization and provide a format to answer questions about their admission.  The group details unit policies and expectations of patients while admitted.  Participation Level:  Active  Participation Quality:  Appropriate  Affect:  Appropriate  Cognitive:  Appropriate  Insight: Appropriate  Engagement in Group:  Engaged  Modes of Intervention:  Discussion  Additional Comments:  Pt stated that his goal for today is to keep a positive mindset.   Burlene Arnt 07/28/2022, 1:56 PM

## 2022-07-28 NOTE — Progress Notes (Signed)
Patient discharged to home via private car. Denies SI, plan to harm self, denies A/V/H. Discharge information discussed with patient, understanding verbalized. All item in locker/at bedside returned to patient upon discharge.

## 2022-07-30 ENCOUNTER — Encounter (HOSPITAL_COMMUNITY): Payer: Self-pay

## 2022-09-11 ENCOUNTER — Ambulatory Visit (HOSPITAL_COMMUNITY): Payer: Medicaid Other | Admitting: Physician Assistant

## 2022-09-19 ENCOUNTER — Encounter (HOSPITAL_COMMUNITY): Payer: Self-pay | Admitting: Emergency Medicine

## 2022-09-19 ENCOUNTER — Ambulatory Visit (HOSPITAL_COMMUNITY)
Admission: EM | Admit: 2022-09-19 | Discharge: 2022-09-19 | Disposition: A | Discharge status: Home / Self Care | Payer: Medicaid Other | Attending: Family Medicine | Admitting: Family Medicine

## 2022-09-19 DIAGNOSIS — R030 Elevated blood-pressure reading, without diagnosis of hypertension: Secondary | ICD-10-CM | POA: Diagnosis not present

## 2022-09-19 DIAGNOSIS — J069 Acute upper respiratory infection, unspecified: Secondary | ICD-10-CM | POA: Diagnosis not present

## 2022-09-19 DIAGNOSIS — J4521 Mild intermittent asthma with (acute) exacerbation: Secondary | ICD-10-CM

## 2022-09-19 MED ORDER — PROMETHAZINE-DM 6.25-15 MG/5ML PO SYRP: 5.0000 mL | ORAL_SOLUTION | Freq: Four times a day (QID) | ORAL | 0 refills | Status: DC | PRN

## 2022-09-19 MED ORDER — PREDNISONE 50 MG PO TABS: ORAL_TABLET | ORAL | 0 refills | Status: DC

## 2022-09-19 NOTE — ED Triage Notes (Signed)
Patient c/o feeling sick x 3 days, congestion, cough, nasal drainage, feeling tired and headache.  Patient denies any OTC cold meds.

## 2022-09-19 NOTE — Discharge Instructions (Signed)
Your blood pressure was noted to be elevated during your visit today. If you are currently taking medication for high blood pressure, please ensure you are taking this as directed. If you do not have a history of high blood pressure and your blood pressure remains persistently elevated, you may need to begin taking a medication at some point. You may return here within the next few days to recheck if unable to see your primary care provider or if you do not have a one.  BP (!) 163/105 (BP Location: Left Arm)   Pulse 86   Temp 98.4 F (36.9 C) (Oral)   Resp 18   Ht 5\' 8"  (1.727 m)   Wt 108.9 kg   SpO2 91%   BMI 36.49 kg/m   BP Readings from Last 3 Encounters:  09/19/22 (!) 163/105  07/28/22 (!) 155/89  07/23/22 (!) 149/82

## 2022-09-20 NOTE — ED Provider Notes (Signed)
[facility] CARE [facility]ENTER   [id number] 09/19/22 Arrival Time: 1750  ASSESSMENT & PLAN:  1. Viral URI with cough   2. Mild intermittent asthma with acute exacerbation   3. Elevated blood pressure reading without diagnosis of hypertension    Without resp distress. Discussed typical duration of likely viral illness. OTC symptom care as needed.  Discharge Medication List as of 09/19/2022  7:22 PM     START taking these medications   Details  predniSONE (DELTASONE) 50 MG tablet Take one tablet by mouth for 5 days., Normal    promethazine-dextromethorphan (PROMETHAZINE-DM) 6.25-15 MG/5ML syrup Take 5 mLs by mouth 4 (four) times daily as needed for cough., Starting Wed 09/19/2022, Normal         Discharge Instructions      Your blood pressure was noted to be elevated during your visit today. If you are currently taking medication for high blood pressure, please ensure you are taking this as directed. If you do not have a history of high blood pressure and your blood pressure remains persistently elevated, you may need to begin taking a medication at some point. You may return here within the next few days to recheck if unable to see your primary care provider or if you do not have a one.  BP (!) 163/105 (BP Location: Left Arm)   Pulse 86   Temp 98.4 F (36.9 C) (Oral)   Resp 18   Ht 5\' 8"  (1.727 m)   Wt 108.9 kg   SpO2 91%   BMI 36.49 kg/m   BP Readings from Last 3 Encounters:  09/19/22 (!) 163/105  07/28/22 (!) 155/89  07/23/22 (!) 149/82          Reviewed expectations re: course of current medical issues. Questions answered. Outlined signs and symptoms indicating need for more acute intervention. Understanding verbalized. After Visit Summary given.   SUBJECTIVE: History from: Patient. Justin Anderson is a 22 y.o. male. Patient c/o feeling sick x 3 days, congestion, cough, nasal drainage, feeling tired and headache.  Patient denies any OTC cold meds. With  wheezing today. Denies specific SOB/CP.  Normal PO intake without n/v/d.  OBJECTIVE:  Vitals:   09/19/22 1848 09/19/22 1850  BP: (!) 163/105   Pulse: 86   Resp: 18   Temp: 98.4 F (36.9 C)   TempSrc: Oral   SpO2: 91%   Weight:  108.9 kg  Height:  5\' 8"  (1.727 m)    General appearance: alert; no distress Eyes: PERRLA; EOMI; conjunctiva normal HENT: [place]; AT; with nasal congestion Neck: supple  Lungs: speaks full sentences without difficulty; unlabored; bilat exp wheezing Extremities: no edema Skin: warm and dry Neurologic: normal gait Psychological: alert and cooperative; normal mood and affect  Labs: Results for orders placed or performed during the hospital encounter of 07/22/22  Comprehensive metabolic panel  Result Value Ref Range   Sodium 137 135 - 145 mmol/L   Potassium 3.6 3.5 - 5.1 mmol/L   Chloride 103 98 - 111 mmol/L   CO2 27 22 - 32 mmol/L   Glucose, Bld 85 70 - 99 mg/dL   BUN 6 6 - 20 mg/dL   Creatinine, Ser 4.09 0.61 - 1.24 mg/dL   Calcium 9.1 8.9 - 81.1 mg/dL   Total Protein 7.2 6.5 - 8.1 g/dL   Albumin 3.8 3.5 - 5.0 g/dL   AST 22 15 - 41 U/L   ALT 23 0 - 44 U/L   Alkaline Phosphatase 74 38 - 126 U/L  Total Bilirubin 0.7 0.3 - 1.2 mg/dL   GFR, Estimated >74 >25 mL/min   Anion gap 7 5 - 15  Ethanol  Result Value Ref Range   Alcohol, Ethyl (B) <10 <10 mg/dL  Salicylate level  Result Value Ref Range   Salicylate Lvl <7.0 (L) 7.0 - 30.0 mg/dL  Acetaminophen level  Result Value Ref Range   Acetaminophen (Tylenol), Serum <10 (L) 10 - 30 ug/mL  cbc  Result Value Ref Range   WBC 7.4 4.0 - 10.5 K/uL   RBC 5.18 4.22 - 5.81 MIL/uL   Hemoglobin 14.6 13.0 - 17.0 g/dL   HCT 95.6 38.7 - 56.4 %   MCV 87.5 80.0 - 100.0 fL   MCH 28.2 26.0 - 34.0 pg   MCHC 32.2 30.0 - 36.0 g/dL   RDW 33.2 95.1 - 88.4 %   Platelets 318 150 - 400 K/uL   nRBC 0.0 0.0 - 0.2 %  Rapid urine drug screen (hospital performed)  Result Value Ref Range   Opiates NONE DETECTED NONE  DETECTED   Cocaine NONE DETECTED NONE DETECTED   Benzodiazepines NONE DETECTED NONE DETECTED   Amphetamines NONE DETECTED NONE DETECTED   Tetrahydrocannabinol POSITIVE (A) NONE DETECTED   Barbiturates NONE DETECTED NONE DETECTED   Labs Reviewed - No data to display  Imaging: No results found.  Allergies  Allergen Reactions   Pollen Extract     Past Medical History:  Diagnosis Date   Arthritis    Environmental allergies    Social History   Socioeconomic History   Marital status: Single    Spouse name: Not on file   Number of children: Not on file   Years of education: Not on file   Highest education level: Not on file  Occupational History   Not on file  Tobacco Use   Smoking status: Every Day    Types: Cigars   Smokeless tobacco: Never  Vaping Use   Vaping status: Never Used  Substance and Sexual Activity   Alcohol use: Yes    Alcohol/week: 4.0 standard drinks of alcohol    Types: 4 Shots of liquor per week   Drug use: Yes    Types: Marijuana   Sexual activity: Not Currently  Other Topics Concern   Not on file  Social History Narrative   ** Merged History Encounter **       Social Determinants of Health   Financial Resource Strain: Not on file  Food Insecurity: No Food Insecurity (07/23/2022)   Hunger Vital Sign    Worried About Running Out of Food in the Last Year: Never true    Ran Out of Food in the Last Year: Never true  Transportation Needs: No Transportation Needs (07/23/2022)   PRAPARE - Administrator, Civil Service (Medical): No    Lack of Transportation (Non-Medical): No  Physical Activity: Not on file  Stress: Not on file  Social Connections: Not on file  Intimate Partner Violence: Not At Risk (07/23/2022)   Humiliation, Afraid, Rape, and Kick questionnaire    Fear of Current or Ex-Partner: No    Emotionally Abused: No    Physically Abused: No    Sexually Abused: No   Family History  Problem Relation Age of Onset   Hypertension  Mother    Diabetes type II Father    History reviewed. No pertinent surgical history.   Mardella Layman, MD 09/20/22 1020

## 2022-09-28 ENCOUNTER — Encounter (INDEPENDENT_AMBULATORY_CARE_PROVIDER_SITE_OTHER): Payer: Self-pay | Admitting: Primary Care

## 2022-09-28 ENCOUNTER — Ambulatory Visit (INDEPENDENT_AMBULATORY_CARE_PROVIDER_SITE_OTHER): Payer: Medicaid Other | Admitting: Primary Care

## 2022-09-28 ENCOUNTER — Other Ambulatory Visit (HOSPITAL_COMMUNITY)
Admission: RE | Admit: 2022-09-28 | Discharge: 2022-09-28 | Disposition: A | Discharge status: Home / Self Care | Payer: Medicaid Other | Source: Ambulatory Visit | Attending: Primary Care | Admitting: Primary Care

## 2022-09-28 VITALS — BP 138/95 | HR 95 | Resp 16 | Ht 68.0 in | Wt 267.0 lb

## 2022-09-28 DIAGNOSIS — R062 Wheezing: Secondary | ICD-10-CM

## 2022-09-28 DIAGNOSIS — Z7689 Persons encountering health services in other specified circumstances: Secondary | ICD-10-CM | POA: Diagnosis not present

## 2022-09-28 DIAGNOSIS — Z23 Encounter for immunization: Secondary | ICD-10-CM

## 2022-09-28 DIAGNOSIS — Z1159 Encounter for screening for other viral diseases: Secondary | ICD-10-CM | POA: Diagnosis not present

## 2022-09-28 DIAGNOSIS — Z114 Encounter for screening for human immunodeficiency virus [HIV]: Secondary | ICD-10-CM

## 2022-09-28 DIAGNOSIS — Z118 Encounter for screening for other infectious and parasitic diseases: Secondary | ICD-10-CM | POA: Insufficient documentation

## 2022-09-28 DIAGNOSIS — I10 Essential (primary) hypertension: Secondary | ICD-10-CM | POA: Diagnosis not present

## 2022-09-28 MED ORDER — CLONIDINE HCL 0.2 MG PO TABS: 0.2000 mg | ORAL_TABLET | Freq: Once | ORAL | Status: AC

## 2022-09-28 MED ORDER — HYDROCHLOROTHIAZIDE 12.5 MG PO TABS: 25.0000 mg | ORAL_TABLET | Freq: Every day | ORAL | 1 refills | Status: DC

## 2022-09-28 MED ORDER — AMLODIPINE BESYLATE 10 MG PO TABS: 10.0000 mg | ORAL_TABLET | Freq: Every day | ORAL | 1 refills | Status: DC

## 2022-09-28 MED ORDER — ALBUTEROL SULFATE HFA 108 (90 BASE) MCG/ACT IN AERS: 2.0000 | INHALATION_SPRAY | Freq: Four times a day (QID) | RESPIRATORY_TRACT | 2 refills | Status: AC | PRN

## 2022-09-28 NOTE — Patient Instructions (Addendum)
HPV Vaccine Information for Parents  Human papillomavirus (HPV) is a common virus. It spreads easily from person to person through skin-to-skin or sexual contact. There are many types of HPV viruses. Genital or mucosal HPV can cause warts in the genitals. Cutaneous or nonmucosal HPV can cause warts on the hands or feet. Some genital HPV types may cause cancer. Your child can get a shot to help prevent the HPV types that can cause cancer, genital warts, or warts near the opening of the butt (anus). The vaccine is safe and effective. It is recommended that your child get the vaccine at about 60-85 years of age. Getting the vaccine before your child is sexually active gives them the best protection from HPV through adulthood. How can HPV affect my child? An infection with HPV can cause: Genital warts. Mouth or throat cancer. Cancer of the anus. Cancer of the lowest part of the uterus (cervix), outer male genital area (vulva), or vagina. Cancer of the penis (penile cancer). During pregnancy, HPV can be passed to the baby. This infection can cause warts to form in the baby's throat and mouth. What actions can I take to lower my child's risk for HPV? Have your child get the HPV vaccine before they become sexually active. The best time to get the shot is at around 73-79 years of age. The vaccine may be given to children as young as 33 years old.  If your child gets the vaccine before they are 74 years old, it can be given as 2 shots, 6-12 months apart. In some cases, 3 doses are needed. Your child may need 3 doses if: They get the first dose before they are 23 years old but do not have a second dose within 6-12 months after the first dose. They get their first dose after they are 23 years old. They will need to get the other 2 doses within 6 months of the first dose. They have a weak body defense system (immune system). What are the risks and benefits of the HPV vaccine? Benefits Getting the vaccine  can help prevent certain cancers. These include: Oral cancer. This is cancer of the mouth. Anal cancer. This is cancer of the anus. Cancers of the cervix, vulva, and vagina in females. Penile cancer in males. Your child is less likely to get these cancers if they get the vaccine before they become sexually active. The vaccine also prevents genital warts caused by HPV. Risks In rare cases, side effects and reactions have been reported. These include: Soreness, redness, or swelling at the injection site. Dizziness or fainting. Fever. Headache. Muscle or joint pain. Who should not get the HPV vaccine or wait to get it? Some children should not get the HPV vaccine or should wait to get it. Ask the health care provider if your child should get the vaccine if: Your child has had a severe allergic reaction to other vaccines. Your child is allergic to yeast. Your child has a fever. Your child has had a recent illness. Your child is pregnant or may be pregnant. Where to find more information Centers for Disease Control and Prevention (CDC): TonerPromos.no American Academy of Pediatrics (AAP): healthychildren.org This information is not intended to replace advice given to you by your health care provider. Make sure you discuss any questions you have with your health care provider. Document Revised: 10/06/2021 Document Reviewed: 10/06/2021 Elsevier Patient Education  2024 Elsevier Inc. Influenza Vaccine Injection What is this medication? INFLUENZA VACCINE (in floo EN  zuh vak SEEN) reduces the risk of the influenza (flu). It does not treat influenza. It is still possible to get influenza after receiving this vaccine, but the symptoms may be less severe or not last as long. It works by helping your immune system learn how to fight off a future infection. This medicine may be used for other purposes; ask your health care provider or pharmacist if you have questions. COMMON BRAND NAME(S): Afluria  Quadrivalent, FLUAD Quadrivalent, Fluarix Quadrivalent, Flublok Quadrivalent, FLUCELVAX Quadrivalent, Flulaval Quadrivalent, Fluzone Quadrivalent What should I tell my care team before I take this medication? They need to know if you have any of these conditions: Bleeding disorder like hemophilia Fever or infection Guillain-Barre syndrome or other neurological problems Immune system problems Infection with the human immunodeficiency virus (HIV) or AIDS Low blood platelet counts Multiple sclerosis An unusual or allergic reaction to influenza virus vaccine, latex, other medications, foods, dyes, or preservatives. Different brands of vaccines contain different allergens. Some may contain latex or eggs. Talk to your care team about your allergies to make sure that you get the right vaccine. Pregnant or trying to get pregnant Breastfeeding How should I use this medication? This vaccine is injected into a muscle or under the skin. It is given by your care team. A copy of Vaccine Information Statements will be given before each vaccination. Be sure to read this sheet carefully each time. This sheet may change often. Talk to your care team to see which vaccines are right for you. Some vaccines should not be used in all age groups. Overdosage: If you think you have taken too much of this medicine contact a poison control center or emergency room at once. NOTE: This medicine is only for you. Do not share this medicine with others. What if I miss a dose? This does not apply. What may interact with this medication? Certain medications that lower your immune system, such as etanercept, anakinra, infliximab, adalimumab Certain medications that prevent or treat blood clots, such as warfarin Chemotherapy or radiation therapy Phenytoin Steroid medications, such as prednisone or cortisone Theophylline Vaccines This list may not describe all possible interactions. Give your health care provider a list of  all the medicines, herbs, non-prescription drugs, or dietary supplements you use. Also tell them if you smoke, drink alcohol, or use illegal drugs. Some items may interact with your medicine. What should I watch for while using this medication? Report any side effects that do not go away with your care team. Call your care team if any unusual symptoms occur within 6 weeks of receiving this vaccine. You may still catch the flu, but the illness is not usually as bad. You cannot get the flu from the vaccine. The vaccine will not protect against colds or other illnesses that may cause fever. The vaccine is needed every year. What side effects may I notice from receiving this medication? Side effects that you should report to your care team as soon as possible: Allergic reactions--skin rash, itching, hives, swelling of the face, lips, tongue, or throat Side effects that usually do not require medical attention (report these to your care team if they continue or are bothersome): Chills Fatigue Headache Joint pain Loss of appetite Muscle pain Nausea Pain, redness, or irritation at injection site This list may not describe all possible side effects. Call your doctor for medical advice about side effects. You may report side effects to FDA at 1-800-FDA-1088. Where should I keep my medication? The vaccine is only  given by your care team. It will not be stored at home. NOTE: This sheet is a summary. It may not cover all possible information. If you have questions about this medicine, talk to your doctor, pharmacist, or health care provider.  2024 Elsevier/Gold Standard (2021-06-20 00:00:00)

## 2022-09-28 NOTE — Progress Notes (Signed)
[facility]   Subjective:  Mr. Justin Anderson is a 22 y.o. morbid male presents for hospital discharge (behavioral) , establish care.  He is currently not sexually active but does use condoms heterosexual male.  Patient's blood pressure is 157/112 he is on no blood pressure medication.  Treated urgency with 0.2 mg of clonidine to try to lower BP during visit.  He does admit to having headaches. Patient has  No chest pain, No abdominal pain - No Nausea, No new weakness tingling or numbness, No Cough - shortness of breath.  He does have chest pain however it comes with eating and feels like something is stuck and will not go up or down.  Started appointment with 22 years old and his risk factors high blood pressure, morbid obesity, sleep African-American male risk for stroke heart attack or both.  Patient stated he is scared me to death. Past Medical History:  Diagnosis Date   Arthritis    Environmental allergies      Allergies  Allergen Reactions   Pollen Extract       Current Outpatient Medications on File Prior to Visit  Medication Sig Dispense Refill   amLODipine (NORVASC) 5 MG tablet Take 1 tablet (5 mg total) by mouth daily. 30 tablet 0   clotrimazole (LOTRIMIN) 1 % cream Apply to affected area 2 times daily 28 g 0   ketoconazole (NIZORAL) 2 % shampoo Apply 1 Application topically 2 (two) times a week. Lather onto scalp and chest rash. Allow to sit on the skin for 5-10 minutes, then rinse off with water. Do this twice a week on Mondays and Thursdays for 4 weeks. 120 mL 0   ondansetron (ZOFRAN-ODT) 4 MG disintegrating tablet Take 1 tablet (4 mg total) by mouth every 8 (eight) hours as needed for nausea or vomiting. 20 tablet 0   predniSONE (DELTASONE) 50 MG tablet Take one tablet by mouth for 5 days. 5 tablet 0   risperiDONE (RISPERDAL) 2 MG tablet Take 1 tablet (2 mg total) by mouth at bedtime. 30 tablet 0   sertraline (ZOLOFT) 50 MG tablet Take 1 tablet (50 mg total)  by mouth daily. 30 tablet 0   amoxicillin (AMOXIL) 250 MG/5ML suspension Take 20 mLs (1,000 mg total) by mouth 2 (two) times daily. (Patient not taking: Reported on 09/28/2022) 150 mL 0   Cetirizine HCl (ZYRTEC PO) Take by mouth. (Patient not taking: Reported on 07/23/2022)     ibuprofen (ADVIL) 600 MG tablet Take 1 tablet (600 mg total) by mouth every 6 (six) hours as needed for moderate pain, mild pain or fever. (Patient not taking: Reported on 09/28/2022) 30 tablet 0   promethazine-dextromethorphan (PROMETHAZINE-DM) 6.25-15 MG/5ML syrup Take 5 mLs by mouth 4 (four) times daily as needed for cough. (Patient not taking: Reported on 09/28/2022) 118 mL 0   No current facility-administered medications on file prior to visit.     Review of System: Comprehensive ROS Pertinent positive and negative noted in HPI    Objective:  BP (!) 157/112 (BP Location: Right Arm, Patient Position: Sitting, Cuff Size: Large)   Pulse 69   Resp 16   Ht 5\' 8"  (1.727 m)   Wt 267 lb (121.1 kg)   SpO2 100%   BMI 40.60 kg/m   Filed Weights   09/28/22 0852  Weight: 267 lb (121.1 kg)    Physical Exam: General Appearance: Well nourished, morbid obese in no apparent distress. Eyes: PERRLA, EOMs, conjunctiva no swelling or erythema  Sinuses: No Frontal/maxillary tenderness ENT/Mouth: Ext aud canals clear, TMs without erythema, bulging. No erythema, swelling, or exudate on post pharynx.  Tonsils not swollen or erythematous. Hearing normal.  Neck: Supple, thyroid normal.  Respiratory: Respiratory effort normal, BS equal bilaterally wheezing or stridor.  Cardio: RRR with no MRGs. Brisk peripheral pulses without edema.  Abdomen: Soft, + BS.  Non tender, no guarding, rebound, hernias, masses. Lymphatics: Non tender without lymphadenopathy.  Musculoskeletal: Full ROM, 5/5 strength, normal gait.  Skin: Warm, dry without rashes, lesions, ecchymosis.  Neuro: Cranial nerves intact. Normal muscle tone, no cerebellar symptoms.  Sensation intact.  Psych: Awake and oriented X 3, normal affect, Insight and Judgment appropriate.    Assessment:  Karsyn was seen today for hospitalization follow-up and hypertension.  Diagnoses and all orders for this visit:  Encounter to establish care  Encounter for immunization -     Flu vaccine trivalent PF, 6mos and older(Flulaval,Afluria,Fluarix,Fluzone)  Essential hypertension BP goal - < 120/80 Explained that having normal blood pressure is the goal and medications are helping to get to goal and maintain normal blood pressure. DIET: Limit salt intake, read nutrition labels to check salt content, limit fried and high fatty foods  Avoid using multisymptom OTC cold preparations that generally contain sudafed which can rise BP. Consult with pharmacist on best cold relief products to use for persons with HTN EXERCISE Discussed incorporating exercise such as walking - 30 minutes most days of the week and can do in 10 minute intervals    -     cloNIDine (CATAPRES) tablet 0.2 mg -     hydrochlorothiazide (HYDRODIURIL) 12.5 MG tablet; Take 2 tablets (25 mg total) by mouth daily.     amLODipine (NORVASC) 10 MG tablet; Take 1 tablet (10 mg total) by mouth daily.  Encounter for HCV screening test for low risk patient -     HCV Ab w Reflex to Quant PCR  Screening for chlamydial disease -     Urine cytology ancillary only  Encounter for screening for HIV -     HIV Antibody (routine testing w rflx)  Other orders -     amlodipine (NORVASC) 10 MG tablet; Take 1 tablet (10 mg total) by mouth daily.     d/w OSA on f/u  This note has been created with Education officer, environmental. Any transcriptional errors are unintentional.   [name], NP 09/28/2022, 9:26 AM

## 2022-09-28 NOTE — Addendum Note (Signed)
Addended by: Gwinda Passe on: 09/28/2022 02:11 PM   Modules accepted: Orders

## 2022-09-29 LAB — HIV ANTIBODY (ROUTINE TESTING W REFLEX): HIV Screen 4th Generation wRfx: NONREACTIVE

## 2022-09-29 LAB — HCV INTERPRETATION

## 2022-09-29 LAB — HCV AB W REFLEX TO QUANT PCR: HCV Ab: NONREACTIVE

## 2022-10-01 LAB — URINE CYTOLOGY ANCILLARY ONLY
Chlamydia: NEGATIVE
Comment: NEGATIVE
Comment: NEGATIVE
Comment: NORMAL
Neisseria Gonorrhea: NEGATIVE
Trichomonas: NEGATIVE

## 2022-10-26 ENCOUNTER — Ambulatory Visit (INDEPENDENT_AMBULATORY_CARE_PROVIDER_SITE_OTHER): Payer: Medicaid Other | Admitting: Primary Care

## 2022-11-14 ENCOUNTER — Encounter (INDEPENDENT_AMBULATORY_CARE_PROVIDER_SITE_OTHER): Payer: Self-pay | Admitting: Primary Care

## 2022-11-14 ENCOUNTER — Other Ambulatory Visit (INDEPENDENT_AMBULATORY_CARE_PROVIDER_SITE_OTHER): Payer: Self-pay | Admitting: Primary Care

## 2022-11-14 ENCOUNTER — Ambulatory Visit (INDEPENDENT_AMBULATORY_CARE_PROVIDER_SITE_OTHER): Payer: MEDICAID | Admitting: Primary Care

## 2022-11-14 ENCOUNTER — Other Ambulatory Visit (HOSPITAL_COMMUNITY)
Admission: RE | Admit: 2022-11-14 | Discharge: 2022-11-14 | Disposition: A | Discharge status: Home / Self Care | Payer: MEDICAID | Source: Ambulatory Visit | Attending: Primary Care | Admitting: Primary Care

## 2022-11-14 VITALS — BP 137/83 | HR 86 | Temp 97.7°F | Resp 16 | Ht 68.5 in | Wt 292.0 lb

## 2022-11-14 DIAGNOSIS — Z113 Encounter for screening for infections with a predominantly sexual mode of transmission: Secondary | ICD-10-CM | POA: Diagnosis present

## 2022-11-14 DIAGNOSIS — I10 Essential (primary) hypertension: Secondary | ICD-10-CM

## 2022-11-14 DIAGNOSIS — M25511 Pain in right shoulder: Secondary | ICD-10-CM

## 2022-11-14 MED ORDER — HYDROCHLOROTHIAZIDE 25 MG PO TABS: 25.0000 mg | ORAL_TABLET | Freq: Every day | ORAL | 1 refills | Status: DC

## 2022-11-14 NOTE — Telephone Encounter (Signed)
Copied from CRM [id number]. Topic: General - Other >> Nov 14, 2022  5:58 PM Everette C wrote: Reason for CRM: Medication Refill - Medication: sertraline (ZOLOFT) 50 MG tablet [[id number]]  risperiDONE (RISPERDAL) 2 MG tablet [[id number]]  amLODipine (NORVASC) 10 MG tablet [[id number]]  Has the patient contacted their pharmacy? Yes.   (Agent: If no, request that the patient contact the pharmacy for the refill. If patient does not wish to contact the pharmacy document the reason why and proceed with request.) (Agent: If yes, when and what did the pharmacy advise?)  Preferred Pharmacy (with phone number or street name): [facility] [phone] - [place], [place] - [address] AT [facility] [address] [place] [address] Phone: [phone] Fax: [phone] Hours: Not open 24 hours   Has the patient been seen for an appointment in the last year OR does the patient have an upcoming appointment? Yes.    Agent: Please be advised that RX refills may take up to 3 business days. We ask that you follow-up with your pharmacy.

## 2022-11-14 NOTE — Progress Notes (Signed)
[facility]  Justin Anderson, is a 22 y.o. male  [id number]  [id number]  DOB - [birth year]  Chief Complaint  Patient presents with   Hypertension       Subjective:   Justin Anderson is a 22 y.o. male here today for a follow up visit- HTN. Patient has No headache, No chest pain, No abdominal pain - No Nausea, No new weakness tingling or numbness, No Cough - shortness of breath. C/O stiffness in his right shoulder with excessive use- Trapezius is tight and slightly swollen- massage .    No problems updated.  Allergies  Allergen Reactions   Pollen Extract     Past Medical History:  Diagnosis Date   Arthritis    Environmental allergies     Current Outpatient Medications on File Prior to Visit  Medication Sig Dispense Refill   albuterol (VENTOLIN HFA) 108 (90 Base) MCG/ACT inhaler Inhale 2 puffs into the lungs every 6 (six) hours as needed for wheezing or shortness of breath. 8 g 2   amLODipine (NORVASC) 10 MG tablet Take 1 tablet (10 mg total) by mouth daily. 90 tablet 1   Cetirizine HCl (ZYRTEC PO) Take by mouth.     risperiDONE (RISPERDAL) 2 MG tablet Take 1 tablet (2 mg total) by mouth at bedtime. 30 tablet 0   sertraline (ZOLOFT) 50 MG tablet Take 1 tablet (50 mg total) by mouth daily. 30 tablet 0   clotrimazole (LOTRIMIN) 1 % cream Apply to affected area 2 times daily (Patient not taking: Reported on 11/14/2022) 28 g 0   ketoconazole (NIZORAL) 2 % shampoo Apply 1 Application topically 2 (two) times a week. Lather onto scalp and chest rash. Allow to sit on the skin for 5-10 minutes, then rinse off with water. Do this twice a week on Mondays and Thursdays for 4 weeks. (Patient not taking: Reported on 11/14/2022) 120 mL 0   promethazine-dextromethorphan (PROMETHAZINE-DM) 6.25-15 MG/5ML syrup Take 5 mLs by mouth 4 (four) times daily as needed for cough. (Patient not taking: Reported on 09/28/2022) 118 mL 0   Current Facility-Administered Medications on File  Prior to Visit  Medication Dose Route Frequency Provider Last Rate Last Admin   cloNIDine (CATAPRES) tablet 0.2 mg  0.2 mg Oral Once         Objective:   Vitals:   11/14/22 1006  BP: 137/83  Pulse: 86  Resp: 16  Temp: 97.7 F (36.5 C)  TempSrc: Oral  SpO2: 98%  Weight: 292 lb (132.5 kg)  Height: 5' 8.5" (1.74 m)    Comprehensive ROS Pertinent positive and negative noted in HPI   Exam General appearance : Awake, alert, not in any distress. Speech Clear. Not toxic looking HEENT: Atraumatic and Normocephalic, pupils equally reactive to light and accomodation Neck: Supple, no JVD. No cervical lymphadenopathy.  Chest: Good air entry bilaterally, no added sounds  CVS: S1 S2 regular, no murmurs.  Abdomen: Bowel sounds present, Non tender and not distended with no gaurding, rigidity or rebound. Extremities: B/L Lower Ext shows no edema, both legs are warm to touch Neurology: Awake alert, and oriented X 3, CN II-XII intact, Non focal Skin: No Rash  Data Review Lab Results  Component Value Date   HGBA1C 5.2 07/25/2022    Assessment & Plan  [name] was seen today for hypertension.  Diagnoses and all orders for this visit:  Pain in joint of right shoulder See hpi  Essential hypertension BP goal - < 130/80 Explained that having normal blood  pressure is the goal and medications are helping to get to goal and maintain normal blood pressure. DIET: Limit salt intake, read nutrition labels to check salt content, limit fried and high fatty foods  Avoid using multisymptom OTC cold preparations that generally contain sudafed which can rise BP. Consult with pharmacist on best cold relief products to use for persons with HTN EXERCISE Discussed incorporating exercise such as walking - 30 minutes most days of the week and can do in 10 minute intervals    -     hydrochlorothiazide (HYDRODIURIL) 25 MG tablet; Take 1 tablet (25 mg total) by mouth daily.  Screening for STDs (sexually  transmitted diseases) -     Urine cytology ancillary only     Patient have been counseled extensively about nutrition and exercise. Other issues discussed during this visit include: low cholesterol diet, weight control and daily exercise, foot care, annual eye examinations at Ophthalmology, importance of adherence with medications and regular follow-up. We also discussed long term complications of uncontrolled diabetes and hypertension.   Return in about 3 months (around 02/14/2023).  The patient was given clear instructions to go to ER or return to medical center if symptoms don't improve, worsen or new problems develop. The patient verbalized understanding. The patient was told to call to get lab results if they haven't heard anything in the next week.   This note has been created with Education officer, environmental. Any transcriptional errors are unintentional.   [name], NP 11/18/2022, 11:58 PM

## 2022-11-14 NOTE — Progress Notes (Signed)
HTN Discomfort in left shoulder. Notices after or when he yawns.

## 2022-11-14 NOTE — Patient Instructions (Signed)
Myofascial Pain Syndrome and Fibromyalgia Myofascial pain syndrome and fibromyalgia are both pain disorders. You may feel this pain mainly in your muscles. Myofascial pain syndrome: Always has tender points in the muscles that will cause pain when pressed (trigger points). The pain may come and go. Usually affects your neck, upper back, and shoulder areas. The pain often moves into your arms and hands. Fibromyalgia: Has muscle pains and tenderness that come and go. Is often associated with tiredness (fatigue) and sleep problems. Has trigger points. Tends to be long-lasting (chronic), but is not life-threatening. Fibromyalgia and myofascial pain syndrome are not the same. However, they often occur together. If you have both conditions, each can make the other worse. Both are common and can cause enough pain and fatigue to make day-to-day activities difficult. Both can be hard to diagnose because their symptoms are common in many other conditions. What are the causes? The exact causes of these conditions are not known. What increases the risk? You are more likely to develop either of these conditions if: You have a family history of the condition. You are male. You have certain triggers, such as: Spine disorders. An injury (trauma) or other physical stressors. Being under a lot of stress. Medical conditions such as osteoarthritis, rheumatoid arthritis, or lupus. What are the signs or symptoms? Fibromyalgia The main symptom of fibromyalgia is widespread pain and tenderness in your muscles. Pain is sometimes described as stabbing, shooting, or burning. You may also have: Tingling or numbness. Sleep problems and fatigue. Problems with attention and concentration (fibro fog). Other symptoms may include: Bowel and bladder problems. Headaches. Vision problems. Sensitivity to odors and noises. Depression or mood changes. Painful menstrual periods (dysmenorrhea). Dry skin or eyes. These  symptoms can vary over time. Myofascial pain syndrome Symptoms of myofascial pain syndrome include: Tight, ropy bands of muscle. Uncomfortable sensations in muscle areas. These may include aching, cramping, burning, numbness, tingling, and weakness. Difficulty moving certain parts of the body freely (poor range of motion). How is this diagnosed? This condition may be diagnosed by your symptoms and medical history. You will also have a physical exam. In general: Fibromyalgia is diagnosed if you have pain, fatigue, and other symptoms for more than 3 months, and symptoms cannot be explained by another condition. Myofascial pain syndrome is diagnosed if you have trigger points in your muscles, and those trigger points are tender and cause pain elsewhere in your body (referred pain). How is this treated? Treatment for these conditions depends on the type that you have. For fibromyalgia, a healthy lifestyle is the most important treatment including aerobic and strength exercises. Different types of medicines are used to help treat pain and include: NSAIDs. Medicines for treating depression. Medicines that help control seizures. Medicines that relax the muscles. Treatment for myofascial pain syndrome includes: Pain medicines, such as NSAIDs. Cooling and stretching of muscles. Massage therapy with myofascial release technique. Trigger point injections. Treating these conditions often requires a team of health care providers. These may include: Your primary care provider. A physical therapist. Complementary health care providers, such as massage therapists or acupuncturists. A psychiatrist for cognitive behavioral therapy. Follow these instructions at home: Medicines Take over-the-counter and prescription medicines only as told by your health care provider. Ask your health care provider if the medicine prescribed to you: Requires you to avoid driving or using machinery. Can cause constipation.  You may need to take these actions to prevent or treat constipation: Drink enough fluid to keep your urine pale   yellow. Take over-the-counter or prescription medicines. Eat foods that are high in fiber, such as beans, whole grains, and fresh fruits and vegetables. Limit foods that are high in fat and processed sugars, such as fried or sweet foods. Lifestyle  Do exercises as told by your health care provider or physical therapist. Practice relaxation techniques to control your stress. You may want to try: Biofeedback. Visual imagery. Hypnosis. Muscle relaxation. Yoga. Meditation. Maintain a healthy lifestyle. This includes eating a healthy diet and getting enough sleep. Do not use any products that contain nicotine or tobacco. These products include cigarettes, chewing tobacco, and vaping devices, such as e-cigarettes. If you need help quitting, ask your health care provider. General instructions Talk to your health care provider about complementary treatments, such as acupuncture or massage. Do not do activities that stress or strain your muscles. This includes repetitive motions and heavy lifting. Keep all follow-up visits. This is important. Where to find support Consider joining a support group with others who are diagnosed with this condition. National Fibromyalgia Association: fmaware.org Where to find more information U.S. Pain Foundation: uspainfoundation.org Contact a health care provider if: You have new symptoms. Your symptoms get worse or your pain is severe. You have side effects from your medicines. You have trouble sleeping. Your condition is causing depression or anxiety. Get help right away if: You have thoughts of hurting yourself or others. Get help right away if you feel like you may hurt yourself or others, or have thoughts about taking your own life. Go to your nearest emergency room or: Call 911. Call the National Suicide Prevention Lifeline at 1-800-273-8255  or 988. This is open 24 hours a day. Text the Crisis Text Line at 741741. This information is not intended to replace advice given to you by your health care provider. Make sure you discuss any questions you have with your health care provider. Document Revised: 10/16/2021 Document Reviewed: 12/09/2020 Elsevier Patient Education  2024 [employer]  

## 2022-11-15 LAB — URINE CYTOLOGY ANCILLARY ONLY
Chlamydia: NEGATIVE
Comment: NEGATIVE
Comment: NEGATIVE
Comment: NORMAL
Neisseria Gonorrhea: NEGATIVE
Trichomonas: NEGATIVE

## 2022-11-16 NOTE — Telephone Encounter (Signed)
Requested medication (s) are due for refill today- unsure  Requested medication (s) are on the active medication list -yes  Future visit scheduled -yes  Last refill: sertraline 07/28/22 #30                  Risperidone 07/28/22 #30  Notes to clinic: outside provider, non delegated Rx  Requested Prescriptions  Pending Prescriptions Disp Refills   sertraline (ZOLOFT) 50 MG tablet 30 tablet 0    Sig: Take 1 tablet (50 mg total) by mouth daily.     Psychiatry:  Antidepressants - SSRI - sertraline Passed - 11/15/2022  6:27 AM      Passed - AST in normal range and within 360 days    AST  Date Value Ref Range Status  07/22/2022 22 15 - 41 U/L Final         Passed - ALT in normal range and within 360 days    ALT  Date Value Ref Range Status  07/22/2022 23 0 - 44 U/L Final         Passed - Completed PHQ-2 or PHQ-9 in the last 360 days      Passed - Valid encounter within last 6 months    Recent Outpatient Visits           2 days ago Pain in joint of right shoulder   [place] [facility] [name], NP   1 month ago Encounter to establish care   [place] [facility] [name], NP       Future Appointments             In 3 months [name], NP  [facility]             risperiDONE (RISPERDAL) 2 MG tablet 30 tablet 0    Sig: Take 1 tablet (2 mg total) by mouth at bedtime.     Not Delegated - Psychiatry:  Antipsychotics - Second Generation (Atypical) - risperidone Failed - 11/15/2022  6:27 AM      Failed - This refill cannot be delegated      Failed - TSH in normal range and within 360 days    No results found for: "TSH", "POCTSH", "TSHREFLEX"       Failed - Lipid Panel in normal range within the last 12 months    Cholesterol  Date Value Ref Range Status  07/25/2022 74 0 - 200 mg/dL Final   LDL Cholesterol  Date Value Ref Range Status  07/25/2022 31 0 - 99 mg/dL Final     Comment:           Total Cholesterol/HDL:CHD Risk Coronary Heart Disease Risk Table                     Men   Women  1/2 Average Risk   3.4   3.3  Average Risk       5.0   4.4  2 X Average Risk   9.6   7.1  3 X Average Risk  23.4   11.0        Use the calculated Patient Ratio above and the CHD Risk Table to determine the patient's CHD Risk.        ATP III CLASSIFICATION (LDL):  <100     mg/dL   Optimal  161-096  mg/dL   Near or Above  Optimal  130-159  mg/dL   Borderline  191-478  mg/dL   High  >295     mg/dL   Very High Performed at [facility], [address], [place], [place] [address]    HDL  Date Value Ref Range Status  07/25/2022 25 (L) >40 mg/dL Final   Triglycerides  Date Value Ref Range Status  07/25/2022 89 <150 mg/dL Final         Failed - CBC within normal limits and completed in the last 12 months    WBC  Date Value Ref Range Status  07/22/2022 7.4 4.0 - 10.5 K/uL Final   RBC  Date Value Ref Range Status  07/22/2022 5.18 4.22 - 5.81 MIL/uL Final   Hemoglobin  Date Value Ref Range Status  07/22/2022 14.6 13.0 - 17.0 g/dL Final   HCT  Date Value Ref Range Status  07/22/2022 45.3 39.0 - 52.0 % Final   MCHC  Date Value Ref Range Status  07/22/2022 32.2 30.0 - 36.0 g/dL Final   [facility]  Date Value Ref Range Status  07/22/2022 28.2 26.0 - 34.0 pg Final   MCV  Date Value Ref Range Status  07/22/2022 87.5 80.0 - 100.0 fL Final   No results found for: "PLTCOUNTKUC", "LABPLAT", "POCPLA" RDW  Date Value Ref Range Status  07/22/2022 14.3 11.5 - 15.5 % Final         Passed - Completed PHQ-2 or PHQ-9 in the last 360 days      Passed - Last BP in normal range    BP Readings from Last 1 Encounters:  11/14/22 137/83         Passed - Last Heart Rate in normal range    Pulse Readings from Last 1 Encounters:  11/14/22 86         Passed - Valid encounter within last 6 months    Recent Outpatient Visits            2 days ago Pain in joint of right shoulder   [place] [facility] [name], NP   1 month ago Encounter to establish care   [place] [facility] [name], NP       Future Appointments             In 3 months [name], NP [place] [facility]            Passed - CMP within normal limits and completed in the last 12 months    Albumin  Date Value Ref Range Status  07/22/2022 3.8 3.5 - 5.0 g/dL Final   Alkaline Phosphatase  Date Value Ref Range Status  07/22/2022 74 38 - 126 U/L Final   ALT  Date Value Ref Range Status  07/22/2022 23 0 - 44 U/L Final   AST  Date Value Ref Range Status  07/22/2022 22 15 - 41 U/L Final   BUN  Date Value Ref Range Status  07/22/2022 6 6 - 20 mg/dL Final   Calcium  Date Value Ref Range Status  07/22/2022 9.1 8.9 - 10.3 mg/dL Final   CO2  Date Value Ref Range Status  07/22/2022 27 22 - 32 mmol/L Final   Creatinine, Ser  Date Value Ref Range Status  07/22/2022 0.91 0.61 - 1.24 mg/dL Final   Glucose, Bld  Date Value Ref Range Status  07/22/2022 85 70 - 99 mg/dL Final    Comment:  Glucose reference range applies only to samples taken after fasting for at least 8 hours.   Glucose-Capillary  Date Value Ref Range Status  12/18/2020 97 70 - 99 mg/dL Final    Comment:    Glucose reference range applies only to samples taken after fasting for at least 8 hours.   Potassium  Date Value Ref Range Status  07/22/2022 3.6 3.5 - 5.1 mmol/L Final   Sodium  Date Value Ref Range Status  07/22/2022 137 135 - 145 mmol/L Final   Total Bilirubin  Date Value Ref Range Status  07/22/2022 0.7 0.3 - 1.2 mg/dL Final   Total Protein  Date Value Ref Range Status  07/22/2022 7.2 6.5 - 8.1 g/dL Final   GFR, Estimated  Date Value Ref Range Status  07/22/2022 >60 >60 mL/min Final    Comment:    (NOTE) Calculated using the CKD-EPI Creatinine Equation  (2021)          Refused Prescriptions Disp Refills   amLODipine (NORVASC) 10 MG tablet 90 tablet 1    Sig: Take 1 tablet (10 mg total) by mouth daily.     Cardiovascular: Calcium Channel Blockers 2 Passed - 11/15/2022  6:27 AM      Passed - Last BP in normal range    BP Readings from Last 1 Encounters:  11/14/22 137/83         Passed - Last Heart Rate in normal range    Pulse Readings from Last 1 Encounters:  11/14/22 86         Passed - Valid encounter within last 6 months    Recent Outpatient Visits           2 days ago Pain in joint of right shoulder   [place] [facility] [name], NP   1 month ago Encounter to establish care   [place] [facility] [name], NP       Future Appointments             In 3 months [name], NP [place] [facility]               Requested Prescriptions  Pending Prescriptions Disp Refills   sertraline (ZOLOFT) 50 MG tablet 30 tablet 0    Sig: Take 1 tablet (50 mg total) by mouth daily.     Psychiatry:  Antidepressants - SSRI - sertraline Passed - 11/15/2022  6:27 AM      Passed - AST in normal range and within 360 days    AST  Date Value Ref Range Status  07/22/2022 22 15 - 41 U/L Final         Passed - ALT in normal range and within 360 days    ALT  Date Value Ref Range Status  07/22/2022 23 0 - 44 U/L Final         Passed - Completed PHQ-2 or PHQ-9 in the last 360 days      Passed - Valid encounter within last 6 months    Recent Outpatient Visits           2 days ago Pain in joint of right shoulder   [place] [facility] [name], NP   1 month ago Encounter to establish care   [place] [facility] [name], NP       Future Appointments  In 3 months [name], NP [place] [facility]             risperiDONE  (RISPERDAL) 2 MG tablet 30 tablet 0    Sig: Take 1 tablet (2 mg total) by mouth at bedtime.     Not Delegated - Psychiatry:  Antipsychotics - Second Generation (Atypical) - risperidone Failed - 11/15/2022  6:27 AM      Failed - This refill cannot be delegated      Failed - TSH in normal range and within 360 days    No results found for: "TSH", "POCTSH", "TSHREFLEX"       Failed - Lipid Panel in normal range within the last 12 months    Cholesterol  Date Value Ref Range Status  07/25/2022 74 0 - 200 mg/dL Final   LDL Cholesterol  Date Value Ref Range Status  07/25/2022 31 0 - 99 mg/dL Final    Comment:           Total Cholesterol/HDL:CHD Risk Coronary Heart Disease Risk Table                     Men   Women  1/2 Average Risk   3.4   3.3  Average Risk       5.0   4.4  2 X Average Risk   9.6   7.1  3 X Average Risk  23.4   11.0        Use the calculated Patient Ratio above and the CHD Risk Table to determine the patient's CHD Risk.        ATP III CLASSIFICATION (LDL):  <100     mg/dL   Optimal  725-366  mg/dL   Near or Above                    Optimal  130-159  mg/dL   Borderline  440-347  mg/dL   High  >425     mg/dL   Very High Performed at [facility], [address] [place], [place] [address]    HDL  Date Value Ref Range Status  07/25/2022 25 (L) >40 mg/dL Final   Triglycerides  Date Value Ref Range Status  07/25/2022 89 <150 mg/dL Final         Failed - CBC within normal limits and completed in the last 12 months    WBC  Date Value Ref Range Status  07/22/2022 7.4 4.0 - 10.5 K/uL Final   RBC  Date Value Ref Range Status  07/22/2022 5.18 4.22 - 5.81 MIL/uL Final   Hemoglobin  Date Value Ref Range Status  07/22/2022 14.6 13.0 - 17.0 g/dL Final   HCT  Date Value Ref Range Status  07/22/2022 45.3 39.0 - 52.0 % Final   MCHC  Date Value Ref Range Status  07/22/2022 32.2 30.0 - 36.0 g/dL Final   [facility]  Date Value Ref Range Status   07/22/2022 28.2 26.0 - 34.0 pg Final   MCV  Date Value Ref Range Status  07/22/2022 87.5 80.0 - 100.0 fL Final   No results found for: "PLTCOUNTKUC", "LABPLAT", "POCPLA" RDW  Date Value Ref Range Status  07/22/2022 14.3 11.5 - 15.5 % Final         Passed - Completed PHQ-2 or PHQ-9 in the last 360 days      Passed - Last BP in normal range    BP Readings from Last 1 Encounters:  11/14/22 137/83         Passed - Last Heart Rate in normal range    Pulse Readings from Last 1 Encounters:  11/14/22 86         Passed - Valid encounter within last 6 months    Recent Outpatient Visits           2 days ago Pain in joint of right shoulder   [place] [facility] [name], NP   1 month ago Encounter to establish care   [place] [facility] [name], NP       Future Appointments             In 3 months [name], NP [place] [facility]            Passed - CMP within normal limits and completed in the last 12 months    Albumin  Date Value Ref Range Status  07/22/2022 3.8 3.5 - 5.0 g/dL Final   Alkaline Phosphatase  Date Value Ref Range Status  07/22/2022 74 38 - 126 U/L Final   ALT  Date Value Ref Range Status  07/22/2022 23 0 - 44 U/L Final   AST  Date Value Ref Range Status  07/22/2022 22 15 - 41 U/L Final   BUN  Date Value Ref Range Status  07/22/2022 6 6 - 20 mg/dL Final   Calcium  Date Value Ref Range Status  07/22/2022 9.1 8.9 - 10.3 mg/dL Final   CO2  Date Value Ref Range Status  07/22/2022 27 22 - 32 mmol/L Final   Creatinine, Ser  Date Value Ref Range Status  07/22/2022 0.91 0.61 - 1.24 mg/dL Final   Glucose, Bld  Date Value Ref Range Status  07/22/2022 85 70 - 99 mg/dL Final    Comment:    Glucose reference range applies only to samples taken after fasting for at least 8 hours.   Glucose-Capillary  Date Value Ref Range Status  12/18/2020 97 70 -  99 mg/dL Final    Comment:    Glucose reference range applies only to samples taken after fasting for at least 8 hours.   Potassium  Date Value Ref Range Status  07/22/2022 3.6 3.5 - 5.1 mmol/L Final   Sodium  Date Value Ref Range Status  07/22/2022 137 135 - 145 mmol/L Final   Total Bilirubin  Date Value Ref Range Status  07/22/2022 0.7 0.3 - 1.2 mg/dL Final   Total Protein  Date Value Ref Range Status  07/22/2022 7.2 6.5 - 8.1 g/dL Final   GFR, Estimated  Date Value Ref Range Status  07/22/2022 >60 >60 mL/min Final    Comment:    (NOTE) Calculated using the CKD-EPI Creatinine Equation (2021)          Refused Prescriptions Disp Refills   amLODipine (NORVASC) 10 MG tablet 90 tablet 1    Sig: Take 1 tablet (10 mg total) by mouth daily.     Cardiovascular: Calcium Channel Blockers 2 Passed - 11/15/2022  6:27 AM      Passed - Last BP in normal range    BP Readings from Last 1 Encounters:  11/14/22 137/83         Passed - Last Heart Rate in normal range    Pulse Readings from Last 1 Encounters:  11/14/22 86         Passed - Valid encounter within last 6 months  Recent Outpatient Visits           2 days ago Pain in joint of right shoulder   [place] [facility] [name], NP   1 month ago Encounter to establish care   [place] [facility] [name], NP       Future Appointments             In 3 months Randa Evens, Kinnie Scales, NP [place] [facility]

## 2022-11-16 NOTE — Telephone Encounter (Signed)
Amlodipine- 09/28/22 #90 1RF- too soon Requested Prescriptions  Pending Prescriptions Disp Refills   sertraline (ZOLOFT) 50 MG tablet 30 tablet 0    Sig: Take 1 tablet (50 mg total) by mouth daily.     Psychiatry:  Antidepressants - SSRI - sertraline Passed - 11/15/2022  6:27 AM      Passed - AST in normal range and within 360 days    AST  Date Value Ref Range Status  07/22/2022 22 15 - 41 U/L Final         Passed - ALT in normal range and within 360 days    ALT  Date Value Ref Range Status  07/22/2022 23 0 - 44 U/L Final         Passed - Completed PHQ-2 or PHQ-9 in the last 360 days      Passed - Valid encounter within last 6 months    Recent Outpatient Visits           2 days ago Pain in joint of right shoulder   [place] [facility] [name], NP   1 month ago Encounter to establish care   [place] [facility] [name], NP       Future Appointments             In 3 months [name], NP [place] [facility]             risperiDONE (RISPERDAL) 2 MG tablet 30 tablet 0    Sig: Take 1 tablet (2 mg total) by mouth at bedtime.     Not Delegated - Psychiatry:  Antipsychotics - Second Generation (Atypical) - risperidone Failed - 11/15/2022  6:27 AM      Failed - This refill cannot be delegated      Failed - TSH in normal range and within 360 days    No results found for: "TSH", "POCTSH", "TSHREFLEX"       Failed - Lipid Panel in normal range within the last 12 months    Cholesterol  Date Value Ref Range Status  07/25/2022 74 0 - 200 mg/dL Final   LDL Cholesterol  Date Value Ref Range Status  07/25/2022 31 0 - 99 mg/dL Final    Comment:           Total Cholesterol/HDL:CHD Risk Coronary Heart Disease Risk Table                     Men   Women  1/2 Average Risk   3.4   3.3  Average Risk       5.0   4.4  2 X Average Risk   9.6   7.1  3 X Average Risk  23.4   11.0         Use the calculated Patient Ratio above and the CHD Risk Table to determine the patient's CHD Risk.        ATP III CLASSIFICATION (LDL):  <100     mg/dL   Optimal  161-096  mg/dL   Near or Above                    Optimal  130-159  mg/dL   Borderline  045-409  mg/dL   High  >811     mg/dL   Very High Performed at [facility], [address] [place], [place] [address]    HDL  Date Value Ref  Range Status  07/25/2022 25 (L) >40 mg/dL Final   Triglycerides  Date Value Ref Range Status  07/25/2022 89 <150 mg/dL Final         Failed - CBC within normal limits and completed in the last 12 months    WBC  Date Value Ref Range Status  07/22/2022 7.4 4.0 - 10.5 K/uL Final   RBC  Date Value Ref Range Status  07/22/2022 5.18 4.22 - 5.81 MIL/uL Final   Hemoglobin  Date Value Ref Range Status  07/22/2022 14.6 13.0 - 17.0 g/dL Final   HCT  Date Value Ref Range Status  07/22/2022 45.3 39.0 - 52.0 % Final   MCHC  Date Value Ref Range Status  07/22/2022 32.2 30.0 - 36.0 g/dL Final   [facility]  Date Value Ref Range Status  07/22/2022 28.2 26.0 - 34.0 pg Final   MCV  Date Value Ref Range Status  07/22/2022 87.5 80.0 - 100.0 fL Final   No results found for: "PLTCOUNTKUC", "LABPLAT", "POCPLA" RDW  Date Value Ref Range Status  07/22/2022 14.3 11.5 - 15.5 % Final         Passed - Completed PHQ-2 or PHQ-9 in the last 360 days      Passed - Last BP in normal range    BP Readings from Last 1 Encounters:  11/14/22 137/83         Passed - Last Heart Rate in normal range    Pulse Readings from Last 1 Encounters:  11/14/22 86         Passed - Valid encounter within last 6 months    Recent Outpatient Visits           2 days ago Pain in joint of right shoulder   [place] [facility] [name], NP   1 month ago Encounter to establish care   [place] [facility] [name], NP       Future  Appointments             In 3 months [name], NP [place] [facility]            Passed - CMP within normal limits and completed in the last 12 months    Albumin  Date Value Ref Range Status  07/22/2022 3.8 3.5 - 5.0 g/dL Final   Alkaline Phosphatase  Date Value Ref Range Status  07/22/2022 74 38 - 126 U/L Final   ALT  Date Value Ref Range Status  07/22/2022 23 0 - 44 U/L Final   AST  Date Value Ref Range Status  07/22/2022 22 15 - 41 U/L Final   BUN  Date Value Ref Range Status  07/22/2022 6 6 - 20 mg/dL Final   Calcium  Date Value Ref Range Status  07/22/2022 9.1 8.9 - 10.3 mg/dL Final   CO2  Date Value Ref Range Status  07/22/2022 27 22 - 32 mmol/L Final   Creatinine, Ser  Date Value Ref Range Status  07/22/2022 0.91 0.61 - 1.24 mg/dL Final   Glucose, Bld  Date Value Ref Range Status  07/22/2022 85 70 - 99 mg/dL Final    Comment:    Glucose reference range applies only to samples taken after fasting for at least 8 hours.   Glucose-Capillary  Date Value Ref Range Status  12/18/2020 97 70 - 99 mg/dL Final    Comment:    Glucose reference range applies only to samples taken  after fasting for at least 8 hours.   Potassium  Date Value Ref Range Status  07/22/2022 3.6 3.5 - 5.1 mmol/L Final   Sodium  Date Value Ref Range Status  07/22/2022 137 135 - 145 mmol/L Final   Total Bilirubin  Date Value Ref Range Status  07/22/2022 0.7 0.3 - 1.2 mg/dL Final   Total Protein  Date Value Ref Range Status  07/22/2022 7.2 6.5 - 8.1 g/dL Final   GFR, Estimated  Date Value Ref Range Status  07/22/2022 >60 >60 mL/min Final    Comment:    (NOTE) Calculated using the CKD-EPI Creatinine Equation (2021)           amLODipine (NORVASC) 10 MG tablet 90 tablet 1    Sig: Take 1 tablet (10 mg total) by mouth daily.     Cardiovascular: Calcium Channel Blockers 2 Passed - 11/15/2022  6:27 AM      Passed - Last BP in normal range     BP Readings from Last 1 Encounters:  11/14/22 137/83         Passed - Last Heart Rate in normal range    Pulse Readings from Last 1 Encounters:  11/14/22 86         Passed - Valid encounter within last 6 months    Recent Outpatient Visits           2 days ago Pain in joint of right shoulder   [place] [facility] [name], NP   1 month ago Encounter to establish care   [place] [facility] [name], NP       Future Appointments             In 3 months Randa Evens, Kinnie Scales, NP [place] [facility]

## 2023-02-14 ENCOUNTER — Encounter (INDEPENDENT_AMBULATORY_CARE_PROVIDER_SITE_OTHER): Payer: Self-pay | Admitting: Primary Care

## 2023-02-14 ENCOUNTER — Telehealth (INDEPENDENT_AMBULATORY_CARE_PROVIDER_SITE_OTHER): Payer: Self-pay | Admitting: Licensed Clinical Social Worker

## 2023-02-14 ENCOUNTER — Encounter (INDEPENDENT_AMBULATORY_CARE_PROVIDER_SITE_OTHER): Payer: Self-pay | Admitting: Licensed Clinical Social Worker

## 2023-02-14 ENCOUNTER — Ambulatory Visit (INDEPENDENT_AMBULATORY_CARE_PROVIDER_SITE_OTHER): Payer: MEDICAID | Admitting: Primary Care

## 2023-02-14 VITALS — BP 140/88 | HR 92 | Resp 16 | Ht 68.0 in | Wt 308.0 lb

## 2023-02-14 DIAGNOSIS — G4733 Obstructive sleep apnea (adult) (pediatric): Secondary | ICD-10-CM

## 2023-02-14 DIAGNOSIS — Z23 Encounter for immunization: Secondary | ICD-10-CM

## 2023-02-14 DIAGNOSIS — F332 Major depressive disorder, recurrent severe without psychotic features: Secondary | ICD-10-CM | POA: Diagnosis not present

## 2023-02-14 DIAGNOSIS — Z6841 Body Mass Index (BMI) 40.0 and over, adult: Secondary | ICD-10-CM

## 2023-02-14 DIAGNOSIS — I10 Essential (primary) hypertension: Secondary | ICD-10-CM | POA: Diagnosis not present

## 2023-02-14 DIAGNOSIS — E785 Hyperlipidemia, unspecified: Secondary | ICD-10-CM

## 2023-02-14 DIAGNOSIS — E66813 Obesity, class 3: Secondary | ICD-10-CM

## 2023-02-14 MED ORDER — SERTRALINE HCL 50 MG PO TABS: 50.0000 mg | ORAL_TABLET | Freq: Every day | ORAL | 1 refills | Status: DC

## 2023-02-14 MED ORDER — AMLODIPINE BESYLATE 10 MG PO TABS: 10.0000 mg | ORAL_TABLET | Freq: Every day | ORAL | 1 refills | Status: DC

## 2023-02-14 MED ORDER — HYDROCHLOROTHIAZIDE 25 MG PO TABS: 25.0000 mg | ORAL_TABLET | Freq: Every day | ORAL | 1 refills | Status: DC

## 2023-02-14 NOTE — Telephone Encounter (Signed)
Met with patient during visit today, during a warm hand off. Patient shared that he is hearing voices and has been for about two years. 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 Patient was referred to a psychologist and also was able to have an appointment with me.  Counseling Resources   [email or web address]  [facility] [address], [place], [place] [address] [phone] or [phone] Walk-in urgent care 24/7 for anyone  For [facility] ONLY New patient assessments and therapy walk-ins: Monday and Wednesday 8am-11am First and second Friday 1pm-5pm New patient psychiatry and medication management walk-ins: Mondays, Wednesdays, Thursdays, Fridays 8am-11am No psychiatry walk-ins on Tuesday   *Accepts all insurance and uninsured for Urgent Care needs *Accepts Medicaid and uninsured for outpatient treatment   [facility] (Therapy and psychiatry) [facility] (near [employer]) [address], [address] [place], [place] [address] [phone] Fax: [phone] (INSURANCE REQUIRED-MEDICAID ACCEPTED)   [facility] Address: Four Locations  -[address] [place], [place], [place]                                 Phone: [phone] -[address], [place], [place]         Phone: [phone] -[address] [place], [place]                      Phone: [phone] -[address], [place], [place]         Phone: [phone] Age Range: Children, Adolescents, and Adults Specialty Areas: Depression, Anxiety, ADHD, Substance Abuse, Bipolar Disorder, etc.  [facility] Address: [address] [place], [place] Phone: [phone] Age Range: Children, Adults, and Elders Specialty Areas: Couple, Family, Group, Individual      [facility] Address: [address] [place], [place] Phone: [phone] Age Range: Children, Adolescents, and Adults Specialty Areas: Individual, Family and Couples Therapy, and Substance Abuse  [facility] Address: [address] [place], [place] [address] Phone: [phone] Age Range: Adolescents/Teenagers and Adults Specialty Areas: Individual, Family, Couples, and Group Counseling   [facility] Address: [address] [place], [place] Phone: [phone] Age Range: Children and Adults Specialty Areas: Individual, Group, and Family counseling to people who have experienced domestic violence or sexual assault  [facility] Address: [address] [place], [place] [address] Phone: [phone] Age Range: Adults & Children (Ages 3+) Specialty Areas: Mental Health and Substance Abuse Counseling Services  [facility] Address: [address] [place], [place], [place] [address] Phone: [phone]             Age Range: All Ages  Specialty Areas: Common mental health diagnoses such as Anxiety, Depression, ADD/ADHD, Bipolar, and PTSD, Substance Abuse Evaluations and Counseling   [facility] at [facility] [address] [place] [address] [place],  [place]  [address] [phone] Call for appointment  [facility] the [place] (Therapy only)  [facility] [address] [place], [place] [address] Monday - Friday: 8:30 a.m.-12 p.m. / 1 p.m.-2:30 p.m.  [facility] [address], [place], [place] [address] Monday-Friday: 8:30 a.m.-12 p.m. / 2-3:30 p.m. (INSURANCE REQUIRED -MEDICAID ACCEPTED) They do offer  a sliding fee scale $20-$30/session   [facility] [address] [place], [place] [address] Phone: [phone]  [facility] [address] [place] [address]  Phone: [phone] (Does not accept Medicaid) (only one  provider accepts Medicare)  [facility] [address] (at [employer], [place] [address] (Accepts Medicaid and Medicare)  [facility]) [address]  [place], [place] [address]  Phone: [phone]  [address] [place], [place] [address] Phone: [phone] [facility] Medicaid) [facility] (Therapy only)  [address] [place], [place] [address] Phone: [phone]   [facility] (Therapy only)  [address] [place], [place] [address] Phone: [phone] [facility])   [facility] [address] [place], [place] [address] Phone: [phone] (Accepts [place] Health Choice) [facility] [phone] [place], [place] [address] Phone: [phone] [facility] (Psychiatry only)  [phone] [address], [place], [place] [address] (Accepts Medicaid and Medicare) [facility] (Psychiatry and therapy)  [address] [place], [place] [address] [phone] [facility] Medicare) [facility] (psychiatry and therapy) [address], [place], [place] [address] [phone]  [facility] at [address], [place], [place] [address] [phone] Accept [address], [place], [place], [place], [place],  and the following types of Medicaid; Alliance, [place], Partners, [place], [place] Health Choice, [employer], [place], [place], and [place], as well as offering a Careers adviser and private payment options. Provides In-Office Appointments, Virtual Appointments, and Phone Consultations Offers medication management for ages 25 years old and up, including,  Medication Management for Suboxone and [facility] [phone] [address], [place], [place] [address] (Accepts Medicaid  and Medicare)         19.  [facility] (therapy only)  [address] [place], [place] [address]            [phone] (Accepts medicare) 20. [facility]  (Suboxone and methodone) [phone] [address], [place], [place] [address] To Be Eligible for Opioid Treatment at [facility] you must be at least 24 years of age you have already tried other interventions that were not successful such as opioid detox, inpatient rehab for opioids, or outpatient counseling specifically for opioid dependency your [facility] drug test must be completely free of benzodiazepines (klonopin, xanax, valium, ativan, or other benz) you have reliable transportation to the [facility] clinic in [place] you recognize that counseling is a critical component of [facility]' Opioid Program and you agree to attend all required counseling sessions you are committed to total drug abstinence and will conscientiously strive to remain free of alcohol, marijuana, and other illicit substances while in treatment you desire a peaceful treatment atmosphere in which personal responsibility and respect toward staff and clients is the norm   21. [facility] [address] [place], [place], [place] [address] Offers [facility] (Substance Abuse Intensive Outpatient Program) [phone] 22. [facility] [address] [place], [place] [address] [phone] (Accepts medicare)  For those who are tech savvy, go on psychology today, type in your local city (i.e. [place]) and specify your insurance at the top of the screen after you search. (Medicaid if needed). You can also specify whether you are interested in therapy and psychiatry.  [email or web address]/us

## 2023-02-14 NOTE — Progress Notes (Signed)
[facility]  Justin Anderson, is a 23 y.o. male  ZOX:[id number]  [id number]  DOB - [birth year]     CC-HTN  Subjective:   Justin Anderson is a 23 y.o. male here today for a follow up visit for the management of hypertension.  He stated he is out of his blood pressure medicine when he goes to get refills there is no medication available blood pressure today is elevated 154/89 on will recheck manually and refill his medications.  Also he has gained 16 pounds since the last visit.  Included in the 6 teen pounds weight gain there were several holidays that was enjoyed with family and friends.  He will now start watching what he eats and exercising reevaluate weight on follow-up visit.  Patient has No chest pain, No abdominal pain - No Nausea, No new weakness tingling or numbness.  He endorses Cough -productive clear and brown,  shortness of breath especially with exertion and has been having headaches frequently.  Headaches can be secondary to elevated blood pressure.  He is not able to check his blood pressure at home Question if snored - yes Patient presents with possible obstructive sleep apnea. Patent has a 10 years history of symptoms of daytime fatigue, morning fatigue, tonsil enlargement, morning headache, and hypertension. Patient generally gets 4 or 5 hours of sleep per night, and states they generally have difficulty falling asleep and difficulty falling back asleep if awakened. Snoring of severe severity is present. Apneic episodes unknown  present. Nasal obstruction is present.  Patient has not had tonsillectomy.   No problems updated.  Comprehensive ROS Pertinent positive and negative noted in HPI   Allergies  Allergen Reactions   Pollen Extract     Past Medical History:  Diagnosis Date   Arthritis    Environmental allergies     Current Outpatient Medications on File Prior to Visit  Medication Sig Dispense Refill   albuterol (VENTOLIN HFA) 108 (90 Base)  MCG/ACT inhaler Inhale 2 puffs into the lungs every 6 (six) hours as needed for wheezing or shortness of breath. 8 g 2   Cetirizine HCl (ZYRTEC PO) Take by mouth.     risperiDONE (RISPERDAL) 2 MG tablet Take 1 tablet (2 mg total) by mouth at bedtime. 30 tablet 0   Current Facility-Administered Medications on File Prior to Visit  Medication Dose Route Frequency Provider Last Rate Last Admin   cloNIDine (CATAPRES) tablet 0.2 mg  0.2 mg Oral Once        Health Maintenance  Topic Date Due   COVID-19 Vaccine (1 - 2024-25 season) Never done   HPV Vaccine (2 - Male 3-dose series) 03/14/2023   DTaP/Tdap/Td vaccine (2 - Td or Tdap) 09/18/2030   Pneumococcal Vaccination  Completed   Flu Shot  Completed   Hepatitis C Screening  Completed   HIV Screening  Completed    Objective:  BP (!) 140/88   Pulse 92   Resp 16   Ht 5\' 8"  (1.727 m)   Wt (!) 308 lb (139.7 kg)   SpO2 96%   BMI 46.83 kg/m   BP Readings from Last 3 Encounters:  02/14/23 (!) 140/88  11/14/22 137/83  09/28/22 (!) 138/95      Physical Exam Vitals reviewed.  Constitutional:      Appearance: He is obese.     Comments: morbid  HENT:     Head: Normocephalic and atraumatic.     Right Ear: Tympanic membrane and external ear normal.  Left Ear: Tympanic membrane and external ear normal.     Nose: Nose normal.     Mouth/Throat:     Comments: Enlarge tonsils Eyes:     Extraocular Movements: Extraocular movements intact.     Pupils: Pupils are equal, round, and reactive to light.  Cardiovascular:     Rate and Rhythm: Normal rate and regular rhythm.  Pulmonary:     Effort: Pulmonary effort is normal.     Breath sounds: Normal breath sounds.  Abdominal:     General: Bowel sounds are normal. There is distension.  Musculoskeletal:        General: Normal range of motion.     Cervical back: Normal range of motion and neck supple.  Skin:    General: Skin is warm and dry.  Neurological:     Mental Status: He is  oriented to person, place, and time.  Psychiatric:        Mood and Affect: Mood normal.        Behavior: Behavior normal.        Judgment: Judgment normal.     Assessment & Plan  [name] was seen today for hypertension.  Diagnoses and all orders for this visit:  Hyperlipidemia, unspecified hyperlipidemia type -     Lipid panel  Essential hypertension BP goal - < 120/70 Explained that having normal blood pressure is the goal and medications are helping to get to goal and maintain normal blood pressure. DIET: Limit salt intake, read nutrition labels to check salt content, limit fried and high fatty foods  Avoid using multisymptom OTC cold preparations that generally contain sudafed which can rise BP. Consult with pharmacist on best cold relief products to use for persons with HTN EXERCISE Discussed incorporating exercise such as walking - 30 minutes most days of the week and can do in 10 minute intervals    -     CBC with Differential/Platelet -     CMP14+EGFR -     amLODipine (NORVASC) 10 MG tablet; Take 1 tablet (10 mg total) by mouth daily. -     hydrochlorothiazide (HYDRODIURIL) 25 MG tablet; Take 1 tablet (25 mg total) by mouth daily.  Encounter for immunization -     Pneumococcal conjugate vaccine 20-valent  Morbid obesity (HCC) Gain 16 lbs since last visit   MDD (major depressive disorder), recurrent severe, without psychosis (HCC) Auditory hallucination not bad voices good. Referred to LCSW and restarted Zoloft 50mg  nightly Flowsheet Row Office Visit from 02/14/2023 in [facility]  PHQ-9 Total Score 11       Other orders -     HPV 9-valent vaccine,Recombinat    Patient have been counseled extensively about nutrition and exercise. Other issues discussed during this visit include: low cholesterol diet, weight control and daily exercise, foot care, annual eye examinations at Ophthalmology, importance of adherence with medications and regular  follow-up. We also discussed long term complications of uncontrolled diabetes and hypertension.   Return in about 3 months (around 05/15/2023).  The patient was given clear instructions to go to ER or return to medical center if symptoms don't improve, worsen or new problems develop. The patient verbalized understanding. The patient was told to call to get lab results if they haven't heard anything in the next week.   This note has been created with Education officer, environmental. Any transcriptional errors are unintentional.   [name], NP 02/14/2023, 8:54 PM

## 2023-02-14 NOTE — Patient Instructions (Addendum)
HPV Vaccine Information for Parents  Human papillomavirus (HPV) is a common virus. It spreads easily from person to person through skin-to-skin or sexual contact. There are many types of HPV viruses. Genital or mucosal HPV can cause warts in the genitals. Cutaneous or nonmucosal HPV can cause warts on the hands or feet. Some genital HPV types may cause cancer. Your child can get a shot to help prevent the HPV types that can cause cancer, genital warts, or warts near the opening of the butt (anus). The vaccine is safe and effective. It is recommended that your child get the vaccine at about 67-22 years of age. Getting the vaccine before your child is sexually active gives them the best protection from HPV through adulthood. How can HPV affect my child? An infection with HPV can cause: Genital warts. Mouth or throat cancer. Cancer of the anus. Cancer of the lowest part of the uterus (cervix), outer male genital area (vulva), or vagina. Cancer of the penis (penile cancer). During pregnancy, HPV can be passed to the baby. This infection can cause warts to form in the baby's throat and mouth. What actions can I take to lower my child's risk for HPV? Have your child get the HPV vaccine before they become sexually active. The best time to get the shot is at around 83-50 years of age. The vaccine may be given to children as young as 26 years old.  If your child gets the vaccine before they are 24 years old, it can be given as 2 shots, 6-12 months apart. In some cases, 3 doses are needed. Your child may need 3 doses if: They get the first dose before they are 24 years old but do not have a second dose within 6-12 months after the first dose. They get their first dose after they are 24 years old. They will need to get the other 2 doses within 6 months of the first dose. They have a weak body defense system (immune system). What are the risks and benefits of the HPV vaccine? Benefits Getting the vaccine  can help prevent certain cancers. These include: Oral cancer. This is cancer of the mouth. Anal cancer. This is cancer of the anus. Cancers of the cervix, vulva, and vagina in females. Penile cancer in males. Your child is less likely to get these cancers if they get the vaccine before they become sexually active. The vaccine also prevents genital warts caused by HPV. Risks In rare cases, side effects and reactions have been reported. These include: Soreness, redness, or swelling at the injection site. Dizziness or fainting. Fever. Headache. Muscle or joint pain. Who should not get the HPV vaccine or wait to get it? Some children should not get the HPV vaccine or should wait to get it. Ask the health care provider if your child should get the vaccine if: Your child has had a severe allergic reaction to other vaccines. Your child is allergic to yeast. Your child has a fever. Your child has had a recent illness. Your child is pregnant or may be pregnant. Where to find more information Centers for Disease Control and Prevention (CDC): TonerPromos.no American Academy of Pediatrics (AAP): healthychildren.org This information is not intended to replace advice given to you by your health care provider. Make sure you discuss any questions you have with your health care provider. Document Revised: 10/06/2021 Document Reviewed: 10/06/2021 Elsevier Patient Education  2024 Elsevier Inc.  Pneumococcal Conjugate Vaccine: What You Need to Know Many vaccine information  statements are available in Spanish and other languages. See PromoAge.com.br. 1. Why get vaccinated? Pneumococcal conjugate vaccine can prevent pneumococcal disease. Pneumococcal disease refers to any illness caused by pneumococcal bacteria. These bacteria can cause many types of illnesses, including pneumonia, which is an infection of the lungs. Pneumococcal bacteria are one of the most common causes of pneumonia. Besides pneumonia,  pneumococcal bacteria can also cause: Ear infections Sinus infections Meningitis (infection of the tissue covering the brain and spinal cord) Bacteremia (infection of the blood) Anyone can get pneumococcal disease, but children under 55 years old, people with certain medical conditions or other risk factors, and adults 65 years or older are at the highest risk. Most pneumococcal infections are mild. However, some can result in long-term problems, such as brain damage or hearing loss. Meningitis, bacteremia, and pneumonia caused by pneumococcal disease can be fatal. 2. Pneumococcal conjugate vaccine Pneumococcal conjugate vaccine helps protect against bacteria that cause pneumococcal disease. There are three pneumococcal conjugate vaccines (PCV13, PCV15, and PCV20). The different vaccines are recommended for different people based on age and medical status. Your health care provider can help you determine which type of pneumococcal conjugate vaccine, and how many doses, you should receive. Infants and young children usually need 4 doses of pneumococcal conjugate vaccine. These doses are recommended at 2, 4, 6, and 49-36 months of age. Older children and adolescents might need pneumococcal conjugate vaccine depending on their age and medical conditions or other risk factors if they did not receive the recommended doses as infants or young children. Adults 19 through 21 years old with certain medical conditions or other risk factors who have not already received pneumococcal conjugate vaccine should receive pneumococcal conjugate vaccine. Adults 65 years or older who have not previously received pneumococcal conjugate vaccine should receive pneumococcal conjugate vaccine. Some people with certain medical conditions are also recommended to receive pneumococcal polysaccharide vaccine (a different type of pneumococcal vaccine known as PPSV23). Some adults who have previously received a pneumococcal conjugate  vaccine may be recommended to receive another pneumococcal conjugate vaccine. 3. Talk with your health care provider Tell your vaccination provider if the person getting the vaccine: Has had an allergic reaction after a previous dose of any type of pneumococcal conjugate vaccine (PCV13, PCV15, PCV20, or an earlier pneumococcal conjugate vaccine known as PCV7), or to any vaccine containing diphtheria toxoid (for example, DTaP), or has any severe, life-threatening allergies In some cases, your health care provider may decide to postpone pneumococcal conjugate vaccination until a future visit. People with minor illnesses, such as a cold, may be vaccinated. People who are moderately or severely ill should usually wait until they recover. Your health care provider can give you more information. 4. Risks of a vaccine reaction Redness, swelling, pain, or tenderness where the shot is given, and fever, loss of appetite, fussiness (irritability), feeling tired, headache, muscle aches, joint pain, and chills can happen after pneumococcal conjugate vaccination. Young children may be at increased risk for seizures caused by fever after a pneumococcal conjugate vaccine if it is administered at the same time as inactivated influenza vaccine. Ask your health care provider for more information. People sometimes faint after medical procedures, including vaccination. Tell your provider if you feel dizzy or have vision changes or ringing in the ears. As with any medicine, there is a very remote chance of a vaccine causing a severe allergic reaction, other serious injury, or death. 5. What if there is a serious problem? An allergic reaction could  occur after the vaccinated person leaves the clinic. If you see signs of a severe allergic reaction (hives, swelling of the face and throat, difficulty breathing, a fast heartbeat, dizziness, or weakness), call 9-1-1 and get the person to the nearest hospital. For other signs that  concern you, call your health care provider. Adverse reactions should be reported to the Vaccine Adverse Event Reporting System (VAERS). Your health care provider will usually file this report, or you can do it yourself. Visit the VAERS website at www.vaers.LAgents.no or call (802)598-4293. VAERS is only for reporting reactions, and VAERS staff members do not give medical advice. 6. The National Vaccine Injury Compensation Program The [employer] Vaccine Injury Compensation Program (VICP) is a federal program that was created to compensate people who may have been injured by certain vaccines. Claims regarding alleged injury or death due to vaccination have a time limit for filing, which may be as short as two years. Visit the VICP website at SpiritualWord.at or call 832-528-0073 to learn about the program and about filing a claim. 7. How can I learn more? Ask your health care provider. Call your local or state health department. Visit the website of the Food and Drug Administration (FDA) for vaccine package inserts and additional information at FinderList.no. Contact the Centers for Disease Control and Prevention (CDC): Call 216-337-1895 (1-800-CDC-INFO) or Visit CDC's website at PicCapture.uy. Source: CDC Vaccine Information Statement (Interim) Pneumococcal Conjugate Vaccine (06/02/2021) This same material is available at FootballExhibition.com.br for no charge. This information is not intended to replace advice given to you by your health care provider. Make sure you discuss any questions you have with your health care provider. Document Revised: 04/25/2022 Document Reviewed: 01/29/2022 Elsevier Patient Education  2024 [employer].

## 2023-02-15 ENCOUNTER — Other Ambulatory Visit (INDEPENDENT_AMBULATORY_CARE_PROVIDER_SITE_OTHER): Payer: Self-pay | Admitting: Primary Care

## 2023-02-15 DIAGNOSIS — E782 Mixed hyperlipidemia: Secondary | ICD-10-CM

## 2023-02-15 LAB — CBC WITH DIFFERENTIAL/PLATELET
Basophils Absolute: 0 10*3/uL (ref 0.0–0.2)
Basos: 0 %
EOS (ABSOLUTE): 0.3 10*3/uL (ref 0.0–0.4)
Eos: 4 %
Hematocrit: 48.2 % (ref 37.5–51.0)
Hemoglobin: 15.4 g/dL (ref 13.0–17.7)
Immature Grans (Abs): 0 10*3/uL (ref 0.0–0.1)
Immature Granulocytes: 0 %
Lymphocytes Absolute: 3.1 10*3/uL (ref 0.7–3.1)
Lymphs: 42 %
MCH: 28.8 pg (ref 26.6–33.0)
MCHC: 32 g/dL (ref 31.5–35.7)
MCV: 90 fL (ref 79–97)
Monocytes Absolute: 0.6 10*3/uL (ref 0.1–0.9)
Monocytes: 8 %
Neutrophils Absolute: 3.3 10*3/uL (ref 1.4–7.0)
Neutrophils: 46 %
Platelets: 326 10*3/uL (ref 150–450)
RBC: 5.35 x10E6/uL (ref 4.14–5.80)
RDW: 13.7 % (ref 11.6–15.4)
WBC: 7.2 10*3/uL (ref 3.4–10.8)

## 2023-02-15 LAB — CMP14+EGFR
ALT: 26 [IU]/L (ref 0–44)
AST: 20 [IU]/L (ref 0–40)
Albumin: 4.4 g/dL (ref 4.3–5.2)
Alkaline Phosphatase: 85 [IU]/L (ref 44–121)
BUN/Creatinine Ratio: 12 (ref 9–20)
BUN: 10 mg/dL (ref 6–20)
Bilirubin Total: 0.3 mg/dL (ref 0.0–1.2)
CO2: 22 mmol/L (ref 20–29)
Calcium: 9.6 mg/dL (ref 8.7–10.2)
Chloride: 100 mmol/L (ref 96–106)
Creatinine, Ser: 0.85 mg/dL (ref 0.76–1.27)
Globulin, Total: 3.3 g/dL (ref 1.5–4.5)
Glucose: 93 mg/dL (ref 70–99)
Potassium: 4.3 mmol/L (ref 3.5–5.2)
Sodium: 138 mmol/L (ref 134–144)
Total Protein: 7.7 g/dL (ref 6.0–8.5)
eGFR: 125 mL/min/{1.73_m2} (ref >59)

## 2023-02-15 LAB — LIPID PANEL
Chol/HDL Ratio: 7.6 {ratio} — ABNORMAL HIGH (ref 0.0–5.0)
Cholesterol, Total: 182 mg/dL (ref 100–199)
HDL: 24 mg/dL — ABNORMAL LOW (ref >39)
LDL Chol Calc (NIH): 108 mg/dL — ABNORMAL HIGH (ref 0–99)
Triglycerides: 288 mg/dL — ABNORMAL HIGH (ref 0–149)
VLDL Cholesterol Cal: 50 mg/dL — ABNORMAL HIGH (ref 5–40)

## 2023-02-15 MED ORDER — ROSUVASTATIN CALCIUM 40 MG PO TABS: 40.0000 mg | ORAL_TABLET | Freq: Every day | ORAL | 1 refills | Status: DC

## 2023-02-26 ENCOUNTER — Encounter (INDEPENDENT_AMBULATORY_CARE_PROVIDER_SITE_OTHER): Payer: Self-pay

## 2023-03-21 ENCOUNTER — Institutional Professional Consult (permissible substitution) (INDEPENDENT_AMBULATORY_CARE_PROVIDER_SITE_OTHER): Payer: Medicaid Other | Admitting: Licensed Clinical Social Worker

## 2023-03-21 ENCOUNTER — Ambulatory Visit (INDEPENDENT_AMBULATORY_CARE_PROVIDER_SITE_OTHER): Payer: Medicaid Other

## 2023-03-28 ENCOUNTER — Institutional Professional Consult (permissible substitution) (INDEPENDENT_AMBULATORY_CARE_PROVIDER_SITE_OTHER): Payer: MEDICAID | Admitting: Licensed Clinical Social Worker

## 2023-03-28 ENCOUNTER — Telehealth (INDEPENDENT_AMBULATORY_CARE_PROVIDER_SITE_OTHER): Payer: Self-pay | Admitting: Primary Care

## 2023-03-28 NOTE — Telephone Encounter (Signed)
 Called pt to see if interested in rescheduling a no show appt. Pt did not answer and could not leave VM.

## 2023-08-14 ENCOUNTER — Ambulatory Visit (INDEPENDENT_AMBULATORY_CARE_PROVIDER_SITE_OTHER): Payer: Self-pay

## 2023-09-02 ENCOUNTER — Ambulatory Visit (INDEPENDENT_AMBULATORY_CARE_PROVIDER_SITE_OTHER)

## 2023-09-02 VITALS — BP 125/87 | HR 80 | Temp 99.0°F | Resp 16 | Ht 68.0 in | Wt 302.0 lb

## 2023-09-02 DIAGNOSIS — Z6841 Body Mass Index (BMI) 40.0 and over, adult: Secondary | ICD-10-CM | POA: Diagnosis not present

## 2023-09-02 DIAGNOSIS — I10 Essential (primary) hypertension: Secondary | ICD-10-CM | POA: Diagnosis not present

## 2023-09-02 DIAGNOSIS — F332 Major depressive disorder, recurrent severe without psychotic features: Secondary | ICD-10-CM

## 2023-09-02 DIAGNOSIS — Z7689 Persons encountering health services in other specified circumstances: Secondary | ICD-10-CM

## 2023-09-02 DIAGNOSIS — K219 Gastro-esophageal reflux disease without esophagitis: Secondary | ICD-10-CM

## 2023-09-02 DIAGNOSIS — E782 Mixed hyperlipidemia: Secondary | ICD-10-CM

## 2023-09-02 MED ORDER — ROSUVASTATIN CALCIUM 40 MG PO TABS: 40.0000 mg | ORAL_TABLET | Freq: Every day | ORAL | 2 refills | Status: DC

## 2023-09-02 MED ORDER — SERTRALINE HCL 50 MG PO TABS: 50.0000 mg | ORAL_TABLET | Freq: Every day | ORAL | 2 refills | Status: DC

## 2023-09-02 MED ORDER — ROSUVASTATIN CALCIUM 40 MG PO TABS: 40.0000 mg | ORAL_TABLET | Freq: Every day | ORAL | 2 refills | Status: AC

## 2023-09-02 MED ORDER — SERTRALINE HCL 50 MG PO TABS: 50.0000 mg | ORAL_TABLET | Freq: Every day | ORAL | 2 refills | Status: AC

## 2023-09-02 MED ORDER — HYDROCHLOROTHIAZIDE 25 MG PO TABS: 25.0000 mg | ORAL_TABLET | Freq: Every day | ORAL | 2 refills | Status: AC

## 2023-09-02 MED ORDER — AMLODIPINE BESYLATE 10 MG PO TABS: 10.0000 mg | ORAL_TABLET | Freq: Every day | ORAL | 2 refills | Status: AC

## 2023-09-02 MED ORDER — HYDROCHLOROTHIAZIDE 25 MG PO TABS: 25.0000 mg | ORAL_TABLET | Freq: Every day | ORAL | 2 refills | Status: DC

## 2023-09-02 MED ORDER — AMLODIPINE BESYLATE 10 MG PO TABS: 10.0000 mg | ORAL_TABLET | Freq: Every day | ORAL | 2 refills | Status: DC

## 2023-09-02 NOTE — Progress Notes (Signed)
 Patient ID: Justin Anderson, male    DOB: [birth year]  MRN: [id number]  CC: Medical Management of Chronic Issues   Subjective: Justin Anderson is a 23 y.o. male with past medical history of major depressive disorder, hypertension, hyperlipidemia who presents to clinic to establish care. Pt's main concern is intermittent chest discomfort, described as burning feeling sometimes sharp pain. Discomfort is worse after eating and when laying down. Denies shortness of breath, heart palpitations or lower extremity swelling. Regarding mood, mood has been stable. Reports some feeling of low energy and desire to perform tasks in the morning but they mood improves throughout the day.  Denies taking cholesterol medication.   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 8 g 2   Cetirizine HCl (ZYRTEC PO) Take by mouth.     risperiDONE  (RISPERDAL ) 2 MG tablet Take 1 tablet (2 mg total) by mouth at bedtime. 30 tablet 0   Current Facility-Administered Medications on File Prior to Visit  Medication Dose Route Frequency Provider Last Rate Last Admin   cloNIDine  (CATAPRES ) tablet 0.2 mg  0.2 mg Oral Once         Allergies  Allergen Reactions   Pollen Extract     Social History   Socioeconomic History   Marital status: Single    Spouse name: Not on file   Number of children: Not on file   Years of education: Not on file   Highest education level: Not on file  Occupational History   Not on file   Tobacco Use   Smoking status: Every Day    Types: Cigars, Cigarettes   Smokeless tobacco: Never  Vaping Use   Vaping status: Never Used  Substance and Sexual Activity   Alcohol use: Yes    Alcohol/week: 4.0 standard drinks of alcohol    Types: 4 Shots of liquor per week   Drug use: Yes    Types: Marijuana   Sexual activity: Not Currently  Other Topics Concern   Not on file  Social History Narrative   ** Merged History Encounter **       Social Drivers of Health   Financial Resource Strain: Low Risk  (09/02/2023)   Overall Financial Resource Strain (CARDIA)    Difficulty of Paying Living Expenses: Not very hard  Food Insecurity: No Food Insecurity (07/23/2022)   Hunger Vital Sign    Worried About Running Out of Food in the Last Year: Never true    Ran Out of Food in the Last Year: Never true  Transportation Needs: No Transportation Needs (07/23/2022)   PRAPARE - Administrator, Civil Service (Medical): No    Lack of Transportation (Non-Medical): No  Physical Activity: Inactive (09/02/2023)   Exercise Vital Sign    Days of Exercise per Week: 0 days    Minutes of Exercise per Session: 0 min  Stress: No Stress Concern Present (09/02/2023)   [employer] of Occupational Health - Occupational Stress Questionnaire    Feeling of Stress: Not  at all  Social Connections: Socially Integrated (09/02/2023)   Social Connection and Isolation Panel    Frequency of Communication with Friends and Family: Three times a week    Frequency of Social Gatherings with Friends and Family: Three times a week    Attends Religious Services: More than 4 times per year    Active Member of Clubs or Organizations: Patient unable to answer    Attends Club or Organization Meetings: 1 to 4 times per year    Marital Status: Living with partner  Intimate Partner Violence: Not At Risk (07/23/2022)   Humiliation, Afraid, Rape, and Kick questionnaire    Fear of Current or Ex-Partner: No     Emotionally Abused: No    Physically Abused: No    Sexually Abused: No    Family History  Problem Relation Age of Onset   Hypertension Mother    Diabetes type II Father     History reviewed. No pertinent surgical history.  ROS: Review of Systems Negative except as stated above  PHYSICAL EXAM: BP 125/87   Pulse 80   Temp 99 F (37.2 C) (Oral)   Resp 16   Ht 5' 8 (1.727 m)   Wt (!) 302 lb (137 kg)   SpO2 93%   BMI 45.92 kg/m   Physical Exam  General: well-appearing, no acute distress Skin: no jaundice, rashes, or lesions Cardiovascular: regular heart rate and rhythm, normal S1/S2, no murmurs, gallops, or rubs, peripheral pulses 2+ bilaterally Chest: no skeletal deformity, lungs clear to auscultation bilaterally, equal breath sounds bilaterally Abdomen: soft, non-distended, non-tender to palpation, no hepatomegaly, no splenomegaly, normoactive bowel sounds Musculoskeletal:  normal gait Extremities: no peripheral edema       Latest Ref Rng & Units 02/14/2023   10:57 AM 07/22/2022    4:30 PM  CMP  Glucose 70 - 99 mg/dL 93  85   BUN 6 - 20 mg/dL 10  6   Creatinine 9.23 - 1.27 mg/dL 9.14  9.08   Sodium 865 - 144 mmol/L 138  137   Potassium 3.5 - 5.2 mmol/L 4.3  3.6   Chloride 96 - 106 mmol/L 100  103   CO2 20 - 29 mmol/L 22  27   Calcium 8.7 - 10.2 mg/dL 9.6  9.1   Total Protein 6.0 - 8.5 g/dL 7.7  7.2   Total Bilirubin 0.0 - 1.2 mg/dL 0.3  0.7   Alkaline Phos 44 - 121 IU/L 85  74   AST 0 - 40 IU/L 20  22   ALT 0 - 44 IU/L 26  23    Lipid Panel     Component Value Date/Time   CHOL 182 02/14/2023 1057   TRIG 288 (H) 02/14/2023 1057   HDL 24 (L) 02/14/2023 1057   CHOLHDL 7.6 (H) 02/14/2023 1057   CHOLHDL 3.0 07/25/2022 0638   VLDL 18 07/25/2022 0638   LDLCALC 108 (H) 02/14/2023 1057    CBC    Component Value Date/Time   WBC 7.2 02/14/2023 1057   WBC 7.4 07/22/2022 1630   RBC 5.35 02/14/2023 1057   RBC 5.18 07/22/2022 1630   HGB 15.4 02/14/2023 1057    HCT 48.2 02/14/2023 1057   PLT 326 02/14/2023 1057   MCV 90 02/14/2023 1057   MCH 28.8 02/14/2023 1057   MCH 28.2 07/22/2022 1630   MCHC 32.0 02/14/2023 1057   MCHC 32.2 07/22/2022 1630   RDW 13.7 02/14/2023 1057   LYMPHSABS 3.1 02/14/2023 1057  EOSABS 0.3 02/14/2023 1057   BASOSABS 0.0 02/14/2023 1057    ASSESSMENT AND PLAN:  1. Encounter to establish care with new provider (Primary) - Plan to follow up in 5 months for yearly physical and labs - Reviewed CBC, CMP, A1C, Lipid Panel lab results with patient today. All questions and concerns were addressed.    2. Morbid obesity (HCC) - Counseling provided to avoid foods that are high in cholesterol and processed foods. -Encouraged to increase physical activity with the goal of of moderate intensity exercise a week.   3. MDD (major depressive disorder), recurrent severe, without psychosis (HCC) - Mood has been stable - Continue sertraline  (ZOLOFT ) 50 MG tablet; Take 1 tablet (50 mg total) by mouth daily.  Dispense: 90 tablet; Refill: 2  4. Essential hypertension - BP in office today is 125/87 which is at goal. Pt congratulated on progress and medication adherence. - hydrochlorothiazide  (HYDRODIURIL ) 25 MG tablet; Take 1 tablet (25 mg total) by mouth daily.  Dispense: 90 tablet; Refill: 2 - amLODipine  (NORVASC ) 10 MG tablet; Take 1 tablet (10 mg total) by mouth daily.  Dispense: 90 tablet; Refill: 2  5. Mixed hyperlipidemia - Reviewed lipid panel test results and advised to continue medication to help lower cholesterol numbers.  - Resume rosuvastatin  (CRESTOR ) 40 MG tablet; Take 1 tablet (40 mg total) by mouth daily.  Dispense: 90 tablet; Refill: 2  6. Gastroesophageal Reflux disease - Counseling and education handout provided to patient regarding diet changes to decrease symptoms of acid reflux.    Patient was given the opportunity to ask questions.  Patient verbalized understanding of the plan and was able to repeat  key elements of the plan. Patient was given clear instructions to go to Emergency Department or return to medical center if symptoms don't improve, worsen, or new problems develop.The patient verbalized understanding.   No orders of the defined types were placed in this encounter.    Requested Prescriptions   Signed Prescriptions Disp Refills   sertraline  (ZOLOFT ) 50 MG tablet 90 tablet 2    Sig: Take 1 tablet (50 mg total) by mouth daily.   hydrochlorothiazide  (HYDRODIURIL ) 25 MG tablet 90 tablet 2    Sig: Take 1 tablet (25 mg total) by mouth daily.   amLODipine  (NORVASC ) 10 MG tablet 90 tablet 2    Sig: Take 1 tablet (10 mg total) by mouth daily.   rosuvastatin  (CRESTOR ) 40 MG tablet 90 tablet 2    Sig: Take 1 tablet (40 mg total) by mouth daily.    Return in about 5 months (around 02/14/2024) for labs, physical.  Sula Leavy Rode, PA-C

## 2023-10-01 ENCOUNTER — Ambulatory Visit: Payer: Self-pay

## 2023-10-01 NOTE — Telephone Encounter (Signed)
 noted

## 2023-10-01 NOTE — Telephone Encounter (Signed)
 FYI Only or Action Required?: FYI only for provider.  Patient was last seen in primary care on 09/02/2023 by Leavy Rode, Sula, PA-C.  Called Nurse Triage reporting Shortness of Breath - cough - URI.  Symptoms began several days ago.  Interventions attempted: OTC medications: Nyquil.  Symptoms are: unchanged.  Triage Disposition: See HCP Within 4 Hours (Or PCP Triage)  Patient/caregiver understands and will follow disposition?: Pt has appt for tomorrow morning. Pt will use Home care tonight and should s/s worsen he will seek immediate care.              Copied from CRM #[id number]. Topic: Clinical - Red Word Triage >> Oct 01, 2023  4:09 PM Dedra B wrote: Red Word that prompted transfer to Nurse Triage: Pt having difficulty breathing and SOB when trying to talk and walk. Warm transfer to nurse triage. Reason for Disposition  [1] MILD difficulty breathing (e.g., minimal/no SOB at rest, SOB with walking, pulse < 100) AND [2] NEW-onset or WORSE than normal  Answer Assessment - Initial Assessment Questions 1. RESPIRATORY STATUS: Describe your breathing? (e.g., wheezing, shortness of breath, unable to speak, severe coughing)      SOB - cough 2. ONSET: When did this breathing problem begin?      2 days ago 3. PATTERN Does the difficult breathing come and go, or has it been constant since it started?      Comes and goes 4. SEVERITY: How bad is your breathing? (e.g., mild, moderate, severe)      Mild 5. RECURRENT SYMPTOM: Have you had difficulty breathing before? If Yes, ask: When was the last time? and What happened that time?      no 6. CARDIAC HISTORY: Do you have any history of heart disease? (e.g., heart attack, angina, bypass surgery, angioplasty)      no 7. LUNG HISTORY: Do you have any history of lung disease?  (e.g., pulmonary embolus, asthma, emphysema)     no 8. CAUSE: What do you think is causing the breathing problem?      URI 9. OTHER  SYMPTOMS: Do you have any other symptoms? (e.g., chest pain, cough, dizziness, fever, runny nose)     No - Sinus drainage - Possible fever  Protocols used: Breathing Difficulty-A-AH

## 2023-10-02 ENCOUNTER — Ambulatory Visit (INDEPENDENT_AMBULATORY_CARE_PROVIDER_SITE_OTHER): Payer: Self-pay | Admitting: Primary Care

## 2023-10-02 ENCOUNTER — Encounter (INDEPENDENT_AMBULATORY_CARE_PROVIDER_SITE_OTHER): Admitting: Primary Care

## 2023-10-07 NOTE — Progress Notes (Signed)
 error

## 2024-02-07 ENCOUNTER — Ambulatory Visit: Payer: Self-pay

## 2024-02-10 ENCOUNTER — Other Ambulatory Visit: Payer: Self-pay

## 2024-02-10 ENCOUNTER — Other Ambulatory Visit: Payer: MEDICAID

## 2024-02-10 DIAGNOSIS — Z13220 Encounter for screening for lipoid disorders: Secondary | ICD-10-CM

## 2024-02-10 DIAGNOSIS — Z131 Encounter for screening for diabetes mellitus: Secondary | ICD-10-CM

## 2024-02-10 NOTE — Progress Notes (Signed)
Patient came in for labs Patient tolerated well

## 2024-02-11 ENCOUNTER — Telehealth: Payer: Self-pay

## 2024-02-11 LAB — LIPID PANEL
Chol/HDL Ratio: 5.2 ratio — ABNORMAL HIGH (ref 0.0–5.0)
Cholesterol, Total: 141 mg/dL (ref 100–199)
HDL: 27 mg/dL — ABNORMAL LOW
LDL Chol Calc (NIH): 82 mg/dL (ref 0–99)
Triglycerides: 187 mg/dL — ABNORMAL HIGH (ref 0–149)
VLDL Cholesterol Cal: 32 mg/dL (ref 5–40)

## 2024-02-11 LAB — HEMOGLOBIN A1C
Est. average glucose Bld gHb Est-mCnc: 114 mg/dL
Hgb A1c MFr Bld: 5.6 % (ref 4.8–5.6)

## 2024-02-11 NOTE — Telephone Encounter (Signed)
 Copied from CRM [id number]. Topic: Clinical - Lab/Test Results >> Feb 11, 2024 12:12 PM Fonda T wrote: Reason for CRM: Pt calling requesting lab results.  Per chart review, provider has not viewed results.  Pt requesting a follow up call to discuss results.  Can be reached at [phone]  Aware of same day follow up call.

## 2024-02-12 NOTE — Telephone Encounter (Signed)
 Provider will speak with pt at next visit

## 2024-02-14 ENCOUNTER — Ambulatory Visit: Payer: Self-pay

## 2024-02-14 ENCOUNTER — Encounter

## 2024-02-14 NOTE — Progress Notes (Signed)
 Your triglycerides are slightly elevated but overall have improved when compared to this time last year. Keep up the great work!  Your A1c ( 3 month average of blood glucose) is 5.6 which is normal. No concerns at this time.

## 2024-02-27 NOTE — Telephone Encounter (Signed)
 Patient call to verify he wanted lab  result mail to his home
